# Patient Record
Sex: Female | Born: 1960 | Race: White | Hispanic: No | Marital: Married | Smoking: Never smoker
Health system: Southern US, Community
[De-identification: ages and names within clinical notes are randomized; demographics above are authoritative.]

## PROBLEM LIST (undated history)

## (undated) DIAGNOSIS — J302 Other seasonal allergic rhinitis: Secondary | ICD-10-CM

## (undated) DIAGNOSIS — R7401 Elevation of levels of liver transaminase levels: Secondary | ICD-10-CM

## (undated) DIAGNOSIS — Z860101 Personal history of adenomatous and serrated colon polyps: Secondary | ICD-10-CM

## (undated) DIAGNOSIS — K219 Gastro-esophageal reflux disease without esophagitis: Secondary | ICD-10-CM

## (undated) DIAGNOSIS — Z8719 Personal history of other diseases of the digestive system: Secondary | ICD-10-CM

## (undated) DIAGNOSIS — K529 Noninfective gastroenteritis and colitis, unspecified: Secondary | ICD-10-CM

## (undated) DIAGNOSIS — T7840XA Allergy, unspecified, initial encounter: Secondary | ICD-10-CM

## (undated) DIAGNOSIS — Z87898 Personal history of other specified conditions: Secondary | ICD-10-CM

## (undated) DIAGNOSIS — E782 Mixed hyperlipidemia: Secondary | ICD-10-CM

## (undated) DIAGNOSIS — M1990 Unspecified osteoarthritis, unspecified site: Secondary | ICD-10-CM

## (undated) DIAGNOSIS — M79671 Pain in right foot: Secondary | ICD-10-CM

## (undated) DIAGNOSIS — N912 Amenorrhea, unspecified: Secondary | ICD-10-CM

## (undated) DIAGNOSIS — Z86010 Personal history of colonic polyps: Secondary | ICD-10-CM

## (undated) DIAGNOSIS — R7301 Impaired fasting glucose: Secondary | ICD-10-CM

## (undated) DIAGNOSIS — R740 Nonspecific elevation of levels of transaminase and lactic acid dehydrogenase [LDH]: Secondary | ICD-10-CM

## (undated) DIAGNOSIS — I10 Essential (primary) hypertension: Secondary | ICD-10-CM

## (undated) DIAGNOSIS — N182 Chronic kidney disease, stage 2 (mild): Secondary | ICD-10-CM

## (undated) DIAGNOSIS — Z5189 Encounter for other specified aftercare: Secondary | ICD-10-CM

## (undated) DIAGNOSIS — I700 Atherosclerosis of aorta: Secondary | ICD-10-CM

## (undated) HISTORY — PX: COLONOSCOPY W/ POLYPECTOMY: SHX1380

## (undated) HISTORY — DX: Unspecified osteoarthritis, unspecified site: M19.90

## (undated) HISTORY — DX: Atherosclerosis of aorta: I70.0

## (undated) HISTORY — DX: Personal history of adenomatous and serrated colon polyps: Z86.0101

## (undated) HISTORY — PX: POLYPECTOMY: SHX149

## (undated) HISTORY — DX: Gastro-esophageal reflux disease without esophagitis: K21.9

## (undated) HISTORY — PX: OTHER SURGICAL HISTORY: SHX169

## (undated) HISTORY — DX: Elevation of levels of liver transaminase levels: R74.01

## (undated) HISTORY — DX: Essential (primary) hypertension: I10

## (undated) HISTORY — DX: Pain in right foot: M79.671

## (undated) HISTORY — DX: Impaired fasting glucose: R73.01

## (undated) HISTORY — DX: Personal history of other specified conditions: Z87.898

## (undated) HISTORY — DX: Personal history of colonic polyps: Z86.010

## (undated) HISTORY — DX: Chronic kidney disease, stage 2 (mild): N18.2

## (undated) HISTORY — PX: COLPOSCOPY: SHX161

## (undated) HISTORY — DX: Personal history of other diseases of the digestive system: Z87.19

## (undated) HISTORY — DX: Encounter for other specified aftercare: Z51.89

## (undated) HISTORY — DX: Other seasonal allergic rhinitis: J30.2

## (undated) HISTORY — DX: Amenorrhea, unspecified: N91.2

## (undated) HISTORY — DX: Nonspecific elevation of levels of transaminase and lactic acid dehydrogenase (ldh): R74.0

---
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
## 2012-09-05 ENCOUNTER — Ambulatory Visit (INDEPENDENT_AMBULATORY_CARE_PROVIDER_SITE_OTHER): Payer: BC Managed Care – PPO | Admitting: Family Medicine

## 2012-09-05 ENCOUNTER — Encounter: Payer: Self-pay | Admitting: Family Medicine

## 2012-09-05 VITALS — BP 166/103 | HR 118 | Temp 98.0°F | Resp 16 | Ht 60.0 in | Wt 162.2 lb

## 2012-09-05 DIAGNOSIS — IMO0002 Reserved for concepts with insufficient information to code with codable children: Secondary | ICD-10-CM

## 2012-09-05 DIAGNOSIS — J309 Allergic rhinitis, unspecified: Secondary | ICD-10-CM

## 2012-09-05 DIAGNOSIS — R6889 Other general symptoms and signs: Secondary | ICD-10-CM

## 2012-09-05 DIAGNOSIS — I10 Essential (primary) hypertension: Secondary | ICD-10-CM

## 2012-09-05 DIAGNOSIS — J302 Other seasonal allergic rhinitis: Secondary | ICD-10-CM

## 2012-09-05 LAB — BASIC METABOLIC PANEL
BUN: 18 mg/dL (ref 6–23)
CO2: 27 mEq/L (ref 19–32)
GFR: 69.71 mL/min (ref >60.00)
Glucose, Bld: 84 mg/dL (ref 70–99)
Potassium: 3.8 mEq/L (ref 3.5–5.1)

## 2012-09-05 MED ORDER — HYDROCHLOROTHIAZIDE 25 MG PO TABS: 25.0000 mg | ORAL_TABLET | Freq: Every day | ORAL | Status: DC

## 2012-09-05 NOTE — Progress Notes (Signed)
Office Note 09/10/2012  CC:  Chief Complaint  Patient presents with  . Establish Care    NP to establish    HPI:  Belinda Cox is a 52 y.o. White female who is here to establish care. Patient's most recent primary MD: she says "none" but in reciting some of her Gates she mentions seeing [facility] and Dr. Greta Doom. Old records were reviewed prior to or during today's visit--she brought in a Pap smear path report from 12/2011.  Patient has had a couple of weeks of runny nose, congestion, sneezing.  Sx's mild, no cough or fever.  She takes some OTC allergy med w/out decongestant but only when her sx's give her a HA.  No ST.  She is not a smoker.  She takes HCTZ for HTN but she doesn't know her dose.  She reports compliance with med, says she hasn't had labs in about a year.  Denies hx of high cholesterol, heart dz, or periph vasc dz.  She has a path report from pap smear done 01/11/2012: AGUS.  Colposcopy and biopsy are recommended per the report and she says she was referred to a GYN--she cannot recall the name of MD or office--and she says they told her "we don't even know why you're here".  Story is unclear to me.  Nevertheless, she is ready for another referral to a GYN MD to get this further looked into. LMP 09/03/12.  She takes OCPs.  Past Medical History  Diagnosis Date  . Osteoarthritis     fingers  . Hypertension   . History of abnormal Pap smear     AGUS    Past Surgical History  Procedure Laterality Date  . Unremarkable      Family History  Problem Relation Age of Onset  . Alcoholism Maternal Grandfather   . Alcoholism Brother   . Arthritis Father     Alive  . Heart disease Maternal Grandmother   . Hypertension Father   . Hypertension Mother     Alive  . Diabetes Mother   . Diabetes Maternal Grandmother   . Diabetes Paternal Grandmother   . Leukemia Sister   . Cancer - Other Maternal Grandmother     Intestinal    History   Social History  .  Marital Status: Married    Spouse Name: N/A    Number of Children: N/A  . Years of Education: N/A   Occupational History  . Not on file.   Social History Main Topics  . Smoking status: Never Smoker   . Smokeless tobacco: Not on file  . Alcohol Use: Not on file  . Drug Use: Not on file  . Sexually Active: Not on file   Other Topics Concern  . Not on file   Social History Narrative   Married, has 2 grown children.   Orig from [place], [place].   Lives in [place], [place].   Occupation: early childhood ed Pharmacist, hospital.   No Tob/alc/drugs.    Outpatient Encounter Prescriptions as of 09/05/2012  Medication Sig Dispense Refill  . Chlorpheniramine Maleate (ALLERGY PO) Take by mouth daily. Aller-Tec allergy.      . hydrochlorothiazide (HYDRODIURIL) 25 MG tablet Take 1 tablet (25 mg total) by mouth daily.  90 tablet  2  . [DISCONTINUED] Norethindrone-Ethinyl Estradiol Triphasic (ARANELLE) 0.5/1/0.5-35 MG-MCG tablet Take 1 tablet by mouth daily.      . [DISCONTINUED] PRESCRIPTION MEDICATION Take 1 each by mouth daily. Hydrochlorothiazide - mg unknown.  No facility-administered encounter medications on file as of 09/05/2012.    No Known Allergies  ROS Review of Systems  Constitutional: Negative for fever and fatigue.  HENT:       See HPI  Eyes: Negative for visual disturbance.  Respiratory: Negative for cough.   Cardiovascular: Negative for chest pain.  Gastrointestinal: Negative for nausea and abdominal pain.  Genitourinary: Negative for dysuria.  Musculoskeletal: Negative for back pain and joint swelling.  Skin: Negative for rash.  Neurological: Negative for weakness and headaches.  Hematological: Negative for adenopathy.    PE; Blood pressure 166/103, pulse 118, temperature 98 F (36.7 C), temperature source Oral, resp. rate 16, height 5' (1.524 m), weight 162 lb 4 oz (73.596 kg), last menstrual period 09/03/2012, SpO2 96.00%. VS: noted--normal. Gen: alert, NAD, NONTOXIC  APPEARING. HEENT: eyes without injection, drainage, or swelling.  Ears: EACs clear, TMs with normal light reflex and landmarks.  Nose: Clear rhinorrhea, with some dried, crusty exudate adherent to mildly injected mucosa.  No purulent d/c.  No paranasal sinus TTP.  No facial swelling.  Throat and mouth without focal lesion.  No pharyngial swelling, erythema, or exudate.   Neck: supple, no LAD.   LUNGS: CTA bilat, nonlabored resps.   CV: RRR, no m/r/g. EXT: no c/c/e SKIN: no rash  Pertinent labs:  None today  ASSESSMENT AND PLAN:   New PT: obtain old records.  HTN (hypertension), benign Check BMET today. Stop OCPs. Encouraged home bp monitoring.  Abnormal Pap smear Refer to GYN: [facility].  Seasonal allergic rhinitis Encouraged saline nasal spray.  She wants to continue generic OTC antihistamines prn. Discussed avoidance of decongestants due to her htn and she expressed understanding.  An After Visit Summary was printed and given to the patient.  Return in about 6 months (around 03/07/2013) for f/u HTN.

## 2012-09-10 ENCOUNTER — Encounter: Payer: Self-pay | Admitting: Family Medicine

## 2012-09-10 ENCOUNTER — Telehealth: Payer: Self-pay | Admitting: *Deleted

## 2012-09-10 ENCOUNTER — Encounter: Payer: Self-pay | Admitting: *Deleted

## 2012-09-10 DIAGNOSIS — IMO0002 Reserved for concepts with insufficient information to code with codable children: Secondary | ICD-10-CM | POA: Insufficient documentation

## 2012-09-10 DIAGNOSIS — I10 Essential (primary) hypertension: Secondary | ICD-10-CM | POA: Insufficient documentation

## 2012-09-10 DIAGNOSIS — J302 Other seasonal allergic rhinitis: Secondary | ICD-10-CM | POA: Insufficient documentation

## 2012-09-10 HISTORY — DX: Other seasonal allergic rhinitis: J30.2

## 2012-09-10 NOTE — Assessment & Plan Note (Signed)
Refer to GYN: [facility].

## 2012-09-10 NOTE — Assessment & Plan Note (Signed)
Encouraged saline nasal spray.  She wants to continue generic OTC antihistamines prn. Discussed avoidance of decongestants due to her htn and she expressed understanding.

## 2012-09-10 NOTE — Assessment & Plan Note (Addendum)
Check BMET today. Stop OCPs. Encouraged home bp monitoring.

## 2012-09-10 NOTE — Telephone Encounter (Signed)
error 

## 2012-09-16 ENCOUNTER — Encounter: Payer: Self-pay | Admitting: Gynecology

## 2012-09-16 ENCOUNTER — Ambulatory Visit (INDEPENDENT_AMBULATORY_CARE_PROVIDER_SITE_OTHER): Payer: BC Managed Care – PPO | Admitting: Gynecology

## 2012-09-16 VITALS — BP 106/60 | Ht 59.5 in | Wt 158.0 lb

## 2012-09-16 DIAGNOSIS — Z0000 Encounter for general adult medical examination without abnormal findings: Secondary | ICD-10-CM

## 2012-09-16 DIAGNOSIS — N915 Oligomenorrhea, unspecified: Secondary | ICD-10-CM

## 2012-09-16 DIAGNOSIS — Z8742 Personal history of other diseases of the female genital tract: Secondary | ICD-10-CM

## 2012-09-16 DIAGNOSIS — R87811 Vaginal high risk human papillomavirus (HPV) DNA test positive: Secondary | ICD-10-CM

## 2012-09-16 DIAGNOSIS — IMO0002 Reserved for concepts with insufficient information to code with codable children: Secondary | ICD-10-CM

## 2012-09-16 DIAGNOSIS — Z01419 Encounter for gynecological examination (general) (routine) without abnormal findings: Secondary | ICD-10-CM

## 2012-09-16 DIAGNOSIS — Z87898 Personal history of other specified conditions: Secondary | ICD-10-CM

## 2012-09-16 LAB — POCT URINALYSIS DIPSTICK
Urobilinogen, UA: NEGATIVE
pH, UA: 5

## 2012-09-16 NOTE — Patient Instructions (Signed)
EXERCISE AND DIET:  We recommended that you start or continue a regular exercise program for good health. Regular exercise means any activity that makes your heart beat faster and makes you sweat.  We recommend exercising at least 30 minutes per day at least 3 days a week, preferably 4 or 5.  We also recommend a diet low in fat and sugar.  Inactivity, poor dietary choices and obesity can cause diabetes, heart attack, stroke, and kidney damage, among others.    ALCOHOL AND SMOKING:  Women should limit their alcohol intake to no more than 7 drinks/beers/glasses of wine (combined, not each!) per week. Moderation of alcohol intake to this level decreases your risk of breast cancer and liver damage. And of course, no recreational drugs are part of a healthy lifestyle.  And absolutely no smoking or even second hand smoke. Most people know smoking can cause heart and lung diseases, but did you know it also contributes to weakening of your bones? Aging of your skin?  Yellowing of your teeth and nails?  CALCIUM AND VITAMIN D:  Adequate intake of calcium and Vitamin D are recommended.  The recommendations for exact amounts of these supplements seem to change often, but generally speaking 600 mg of calcium (either carbonate or citrate) and 800 units of Vitamin D per day seems prudent. Certain women may benefit from higher intake of Vitamin D.  If you are among these women, your doctor will have told you during your visit.    PAP SMEARS:  Pap smears, to check for cervical cancer or precancers,  have traditionally been done yearly, although recent scientific advances have shown that most women can have pap smears less often.  However, every woman still should have a physical exam from her gynecologist every year. It will include a breast check, inspection of the vulva and vagina to check for abnormal growths or skin changes, a visual exam of the cervix, and then an exam to evaluate the size and shape of the uterus and  ovaries.  And after 53 years of age, a rectal exam is indicated to check for rectal cancers. We will also provide age appropriate advice regarding health maintenance, like when you should have certain vaccines, screening for sexually transmitted diseases, bone density testing, colonoscopy, mammograms, etc.   MAMMOGRAMS:  All women over 53 years old should have a yearly mammogram. Many facilities now offer a "3D" mammogram, which may cost around $50 extra out of pocket. If possible,  we recommend you accept the option to have the 3D mammogram performed.  It both reduces the number of women who will be called back for extra views which then turn out to be normal, and it is better than the routine mammogram at detecting truly abnormal areas.    COLONOSCOPY:  Colonoscopy to screen for colon cancer is recommended for all women at age 53.  We know, you hate the idea of the prep.  We agree, BUT, having colon cancer and not knowing it is worse!!  Colon cancer so often starts as a polyp that can be seen and removed at colonscopy, which can quite literally save your life!  And if your first colonoscopy is normal and you have no family history of colon cancer, most women don't have to have it again for 10 years.  Once every ten years, you can do something that may end up saving your life, right?  We will be happy to help you get it scheduled when you are ready.  Be sure to check your insurance coverage so you understand how much it will cost.  It may be covered as a preventative service at no cost, but you should check your particular policy.     Abnormal Pap Test Information During a Pap test, the cells on the surface of your cervix are checked to see if they look normal, abnormal, or if they show signs of having been altered by a certain type of virus called human papillomavirus, or HPV. Cervical cells that have been affected by HPV are called dysplasia. Dysplasia is not cancer, but describes abnormal cells found on  the surface of the cervix. Depending on the degree of dysplasia, some of the cells may be considered pre-cancerous and may turn into cancer over time if follow up with a caregiver is delayed.  WHAT DOES AN ABNORMAL PAP TEST MEAN? Having an abnormal pap test does not mean that you have cancer. However, certain types of abnormal pap tests can be a sign that a person is at a higher risk of developing cancer. Your caregiver will want to do other tests to find out more about the abnormal cells. Your abnormal Pap test results could show:   Small and uncertain changes that should be carefully watched.   Cervical dysplasia that has caused mild changes and can be followed over time.  Cervical dysplasia that is more severe and needs to be followed and treated to ensure the problem goes away.  Cancer.  When severe cervical dysplasia is found and treated early, it rarely will grow into cancer.  WHAT WILL BE DONE ABOUT MY ABNORMAL PAP TEST?  A colposcopy may be needed. This is a procedure where your cervix is examined using light and magnification.  A small tissue sample of your cervix (biopsy) may need to be removed and then examined. This is often performed if there are areas that appear infected.  A sample of cells from the cervical canal may be removed with either a small brush or scraping instrument (curette). Based on the results of the procedures above, some caregivers may recommend either cryotherapy of the cervix or a surgical LEEP where a portion of the cervix is removed. LEEP is short for "loop electrical excisional procedure." Rarely, a caregiver may recommend a cone biopsy.This is a procedure where a small, cone-shaped sample of your cervix is taken out. The part that is taken out is the area where the abnormal cells are.  WHAT IF I HAVE A DYSPLASIA OR A CANCER? You may be referred to a specialist. Radiation may also be a treatment for more advanced cancer. Having a hysterectomy is the  last treatment option for dysplasia, but it is a more common treatment for someone with cancer. All treatment options will be discussed with you by your caregiver. WHAT SHOULD YOU DO AFTER BEING TREATED? If you have had an abnormal pap test, you should continue to have regular pap tests and check-ups as directed by your caregiver. Your cervical problem will be carefully watched so it does not get worse. Also, your caregiver can watch for, and treat, any new problems that may come up. Document Released: 08/16/2010 Document Revised: 07/24/2011 Document Reviewed: 04/27/2011 [facility] Patient Information 2013 [place].

## 2012-09-16 NOTE — Progress Notes (Signed)
52 y.o.  Married  Caucasian female  G2P2. here for annual exam.  Pt had abnormal PAP without follow up in 12/2101 of AGUS here now for evaluation and to discuss contraception.  She was recently taken off oc due to HTN- d/c'd 2w ago.  During sugar pill week.  Pt denies post-coital bleed.  Pt reports some abnormal PAP approx 15y ago but f/u negative and all PAP's normal after.  Pt denies any sx of coming off estrogen, no hot flushes.  Using oc primarily as contraception.  Patient's last menstrual period was 09/03/2012.          Sexually active: yes  The current method of family planning is none.  Exercising: no Last mammogram:  Age 56 Last pap smear: 01/11/12 History of abnormal pap:  yes, AGC/AGUS 01/11/12 Smoking: no Alcohol: no Last colonoscopy: none Last Bone Density:  none Last tetanus shot: not sure  Last cholesterol check: 2 weeks ago BSE: yes  Hgb: PCP   Urine: Leuks 2    Health Maintenance  Topic Date Due  . Pap Smear  01/18/1978  . Tetanus/tdap  01/19/1979  . Mammogram  01/18/2010  . Colonoscopy  01/18/2010  . Influenza Vaccine  01/13/2013    Family History  Problem Relation Age of Onset  . Alcoholism Maternal Grandfather   . Alcoholism Brother   . Arthritis Father     Alive  . Heart disease Maternal Grandmother   . Hypertension Father   . Hypertension Mother     Alive  . Diabetes Mother   . Diabetes Maternal Grandmother   . Diabetes Paternal Grandmother   . Leukemia Sister   . Cancer - Other Maternal Grandmother     Intestinal    Patient Active Problem List   Diagnosis Date Noted  . HTN (hypertension), benign 09/10/2012  . Abnormal Pap smear 09/10/2012  . Seasonal allergic rhinitis 09/10/2012    Past Medical History  Diagnosis Date  . Osteoarthritis     fingers  . Hypertension   . History of abnormal Pap smear     AGUS    Past Surgical History  Procedure Laterality Date  . Unremarkable      Allergies: Review of patient's allergies indicates  no known allergies.  Current Outpatient Prescriptions  Medication Sig Dispense Refill  . Chlorpheniramine Maleate (ALLERGY PO) Take by mouth daily. Aller-Tec allergy.      . hydrochlorothiazide (HYDRODIURIL) 25 MG tablet Take 1 tablet (25 mg total) by mouth daily.  90 tablet  2   No current facility-administered medications for this visit.    ROS: Pertinent items are noted in HPI.  Social Hx:    Exam:    LMP 09/03/2012   Wt Readings from Last 3 Encounters:  09/05/12 162 lb 4 oz (73.596 kg)     Ht Readings from Last 3 Encounters:  09/05/12 5' (1.524 m)    General appearance: alert, cooperative and appears stated age Head: Normocephalic, without obvious abnormality, atraumatic Neck: no adenopathy, supple, symmetrical, trachea midline and thyroid not enlarged, symmetric, no tenderness/mass/nodules Lungs: clear to auscultation bilaterally Breasts: Inspection negative, No nipple retraction or dimpling, No nipple discharge or bleeding, No axillary or supraclavicular adenopathy, Normal to palpation without dominant masses Heart: regular rate and rhythm Abdomen: soft, non-tender; bowel sounds normal; no masses,  no organomegaly Extremities: extremities normal, atraumatic, no cyanosis or edema Skin: Skin color, texture, turgor normal. No rashes or lesions Lymph nodes: Cervical, supraclavicular, and axillary nodes normal. No abnormal inguinal nodes  palpated Neurologic: Grossly normal   Pelvic: External genitalia:  no lesions              Urethra:  normal appearing urethra with no masses, tenderness or lesions              Bartholins and Skenes: normal                 Vagina: normal appearing vagina with normal color and discharge, no lesions              Cervix: normal appearance              Pap taken: yes        Bimanual Exam:  Uterus:  uterus is normal size, shape, consistency and nontender                                      Adnexa: normal adnexa in size, nontender and no  masses                                      Rectovaginal: Confirms                                      Anus:  normal sphincter tone, no lesions  A: normal gyn exam HTN- off oc     P: mammogram stressed pap smear with HR HPV, will triage based on results-expect u/s, EMB, colpo questions addressed Overdue for coloscopy Will evaluate menopausal status off oc return annually or prn     An After Visit Summary was printed and given to the patient.

## 2012-09-19 NOTE — Addendum Note (Signed)
Addended by: Elveria Rising on: 09/19/2012 03:41 PM   Modules accepted: Orders

## 2012-09-20 ENCOUNTER — Telehealth: Payer: Self-pay | Admitting: *Deleted

## 2012-09-20 NOTE — Telephone Encounter (Signed)
Patient returning call.  Advised Pap still showing abnormality and Dr lathrop would like to proceed with evaluation.  Colpo and endo bx scheduled for Mon 09-23-12.  Advied to take Motrin 800 mg 1 hr before with food.

## 2012-09-20 NOTE — Telephone Encounter (Signed)
Message copied by Jaymes Graff on Fri Sep 20, 2012 11:26 AM ------      Message from: Elveria Rising      Created: Thu Sep 19, 2012  3:42 PM       Needs colpo, emb also has h/o agus not prev evaluated, dropped order ------

## 2012-09-20 NOTE — Telephone Encounter (Signed)
Message copied by Jaymes Graff on Fri Sep 20, 2012 10:37 AM ------      Message from: Elveria Rising      Created: Thu Sep 19, 2012  3:42 PM       Needs colpo, emb also has h/o agus not prev evaluated, dropped order ------

## 2012-09-20 NOTE — Telephone Encounter (Signed)
LMTCB

## 2012-09-23 ENCOUNTER — Ambulatory Visit (INDEPENDENT_AMBULATORY_CARE_PROVIDER_SITE_OTHER): Payer: BC Managed Care – PPO | Admitting: Gynecology

## 2012-09-23 VITALS — BP 126/80

## 2012-09-23 DIAGNOSIS — IMO0002 Reserved for concepts with insufficient information to code with codable children: Secondary | ICD-10-CM

## 2012-09-23 DIAGNOSIS — Z87898 Personal history of other specified conditions: Secondary | ICD-10-CM

## 2012-09-23 DIAGNOSIS — R6889 Other general symptoms and signs: Secondary | ICD-10-CM

## 2012-09-23 DIAGNOSIS — Z8742 Personal history of other diseases of the female genital tract: Secondary | ICD-10-CM

## 2012-09-23 NOTE — Procedures (Signed)
52 y.o. MarriedCaucasianfemale   here for colposcopy. 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 History of AGUS 8/13 without further evaluation  Patient's last menstrual period was 09/07/2012.  Contraception:  no method   Blood pressure 126/80, last menstrual period 09/07/2012.  Procedure explained and patient's questions were invited and answered.  Consent form signed.    Role of HPV in genesis of SIL discussed with patient, and questions answered.   Speculum inserted atraumatically and cervix visualized.  3% acetic acid applied.  Cervix examined using 3.75,7.5,15  X magnification and green filter.    Gross appearance:normal  Squamocolumnar junction seen in entirety: yes  Extent of lesion entirely seen: yes   no visible lesions, no mosaicism, no punctation and no abnormal vasculaturecervix swabbed with Lugol's solution, endocervical speculum placed, SCJ visualized - lesion at 12 o'clock, endocervical lesion noted at 6 o'clock, endometrial biopsy performed, cervical biopsies taken at 12,6 o'clock, specimen labelled and sent to pathology and hemostasis achieved with Monsel's solution  Patient tolerated procedure well.   Plan:  Will base further treatment on Pathology findings.  Post biopsy instructions and AVS given to patient.   Uterus sounded to 7cm, 2 passes of tissue, minimal tissue to pathology

## 2012-09-23 NOTE — Progress Notes (Signed)
See procedure note, for colpo and bx- ASCUS HRHVP, H/O AGUS

## 2012-09-24 ENCOUNTER — Other Ambulatory Visit (INDEPENDENT_AMBULATORY_CARE_PROVIDER_SITE_OTHER): Payer: BC Managed Care – PPO

## 2012-09-24 DIAGNOSIS — N915 Oligomenorrhea, unspecified: Secondary | ICD-10-CM

## 2012-09-25 LAB — ESTRADIOL: Estradiol: 18.1 pg/mL

## 2012-09-26 LAB — IPS CERVICAL/ECC/EMB/VULVAR/VAGINAL BIOPSY

## 2012-09-26 LAB — ANTI MULLERIAN HORMONE: AMH AssessR: <0.03 ng/mL

## 2012-10-03 ENCOUNTER — Telehealth: Payer: Self-pay | Admitting: Gynecology

## 2012-10-03 NOTE — Telephone Encounter (Signed)
Patient saw Dr. Charlies Constable on 5/12 and was told she would be prescribed the lowest dose estrogen BC pill.  Has not heard anything.

## 2012-10-03 NOTE — Telephone Encounter (Signed)
Pt asking for a low dose estrogen BC pill, office visit 09/23/12.

## 2012-10-04 NOTE — Telephone Encounter (Signed)
LMTCB  aa

## 2012-10-04 NOTE — Telephone Encounter (Signed)
Pt is postmenopausal by [facility], she was informed, no ocp needed,  watch for any more bleeding

## 2012-10-04 NOTE — Telephone Encounter (Signed)
Pt returned Jasmine's call.

## 2012-10-08 NOTE — Telephone Encounter (Signed)
Pt notfied and aware she does not need a ocp due to her being menopausal; pt is also aware to call if she has any more bleeding.

## 2012-10-08 NOTE — Telephone Encounter (Signed)
Left Message To Call Back  

## 2013-03-20 ENCOUNTER — Other Ambulatory Visit: Payer: Self-pay

## 2013-04-24 ENCOUNTER — Ambulatory Visit (INDEPENDENT_AMBULATORY_CARE_PROVIDER_SITE_OTHER): Payer: BC Managed Care – PPO | Admitting: Family Medicine

## 2013-04-24 ENCOUNTER — Encounter: Payer: Self-pay | Admitting: Family Medicine

## 2013-04-24 VITALS — BP 159/102 | HR 108 | Temp 99.8°F | Resp 18 | Ht 60.0 in | Wt 157.0 lb

## 2013-04-24 DIAGNOSIS — J069 Acute upper respiratory infection, unspecified: Secondary | ICD-10-CM

## 2013-04-24 DIAGNOSIS — J040 Acute laryngitis: Secondary | ICD-10-CM | POA: Insufficient documentation

## 2013-04-24 NOTE — Progress Notes (Signed)
Pre visit review using our clinic review tool, if applicable. No additional management support is needed unless otherwise documented below in the visit note.  OFFICE NOTE  04/24/2013  CC:  Chief Complaint  Patient presents with  . Cough    dry cough  . Fatigue     HPI: Patient is a 52 y.o. Caucasian female who is here for cough. Onset 5 d/a with dry cough, then lost voice, cough is not constant and it hurts in ears when she coughs.  No nasal congestion or drainage.  No body aches or fevers.  Dayquil yesterday evening x 1 dose no help.  No SOB or wheezing.  No ST or rash.  BP at home has been fine per pt.  Pertinent PMH:  Past Medical History  Diagnosis Date  . Osteoarthritis     fingers  . Hypertension   . History of abnormal Pap smear     AGUS  . Amenorrhea   . Blood transfusion without reported diagnosis     Birth    MEDS:  Outpatient Prescriptions Prior to Visit  Medication Sig Dispense Refill  . Chlorpheniramine Maleate (ALLERGY PO) Take by mouth daily. Aller-Tec allergy.      . hydrochlorothiazide (HYDRODIURIL) 25 MG tablet Take 1 tablet (25 mg total) by mouth daily.  90 tablet  2   No facility-administered medications prior to visit.    PE: Blood pressure 159/102, pulse 108, temperature 99.8 F (37.7 C), temperature source Temporal, resp. rate 18, height 5' (1.524 m), weight 157 lb (71.215 kg), last menstrual period 09/07/2012, SpO2 97.00%. Gen: Alert, well appearing.  Patient is oriented to person, place, time, and situation. ENT: Ears: EACs clear, normal epithelium.  TMs with good light reflex and landmarks bilaterally.  Eyes: no injection, icteris, swelling, or exudate.  EOMI, PERRLA. Nose: no drainage or turbinate edema/swelling.  No injection or focal lesion.  Mouth: lips without lesion/swelling.  Oral mucosa pink and moist.  Dentition intact and without obvious caries or gingival swelling.  Oropharynx without erythema, exudate, or swelling.  Neck - No  masses or thyromegaly or limitation in range of motion CV: RRR, no m/r/g.   LUNGS: CTA bilat, nonlabored resps, good aeration in all lung fields.   IMPRESSION AND PLAN:  URI, laryngitis.  No sign of bacterial infection or RAD. REcommended symptomatic care with saline nasal spray, mucinex or mucinex DM, and stay home from work tomorrow. Avoid OTC decongestants.  Call/return if bp up at home persistently.  FOLLOW UP: prn

## 2013-08-11 ENCOUNTER — Ambulatory Visit (INDEPENDENT_AMBULATORY_CARE_PROVIDER_SITE_OTHER): Payer: BC Managed Care – PPO | Admitting: Family Medicine

## 2013-08-11 ENCOUNTER — Encounter: Payer: Self-pay | Admitting: Family Medicine

## 2013-08-11 VITALS — BP 153/107 | HR 95 | Temp 98.6°F | Resp 16 | Ht 60.0 in | Wt 156.0 lb

## 2013-08-11 DIAGNOSIS — Z0000 Encounter for general adult medical examination without abnormal findings: Secondary | ICD-10-CM | POA: Insufficient documentation

## 2013-08-11 DIAGNOSIS — Z1239 Encounter for other screening for malignant neoplasm of breast: Secondary | ICD-10-CM

## 2013-08-11 DIAGNOSIS — Z1211 Encounter for screening for malignant neoplasm of colon: Secondary | ICD-10-CM

## 2013-08-11 DIAGNOSIS — Z23 Encounter for immunization: Secondary | ICD-10-CM

## 2013-08-11 LAB — LIPID PANEL
Cholesterol: 176 mg/dL (ref 0–200)
HDL: 52.7 mg/dL (ref >39.00)
LDL Cholesterol: 84 mg/dL (ref 0–99)
TRIGLYCERIDES: 197 mg/dL — AB (ref 0.0–149.0)
Total CHOL/HDL Ratio: 3
VLDL: 39.4 mg/dL (ref 0.0–40.0)

## 2013-08-11 LAB — COMPREHENSIVE METABOLIC PANEL
ALT: 85 U/L — ABNORMAL HIGH (ref 0–35)
AST: 38 U/L — AB (ref 0–37)
Albumin: 4.5 g/dL (ref 3.5–5.2)
Alkaline Phosphatase: 97 U/L (ref 39–117)
BUN: 14 mg/dL (ref 6–23)
CALCIUM: 9.5 mg/dL (ref 8.4–10.5)
CHLORIDE: 101 meq/L (ref 96–112)
CO2: 28 mEq/L (ref 19–32)
Creatinine, Ser: 0.9 mg/dL (ref 0.4–1.2)
GFR: 71.29 mL/min (ref >60.00)
GLUCOSE: 92 mg/dL (ref 70–99)
POTASSIUM: 3.6 meq/L (ref 3.5–5.1)
SODIUM: 138 meq/L (ref 135–145)
TOTAL PROTEIN: 7.6 g/dL (ref 6.0–8.3)
Total Bilirubin: 0.5 mg/dL (ref 0.3–1.2)

## 2013-08-11 LAB — TSH: TSH: 1.14 u[IU]/mL (ref 0.35–5.50)

## 2013-08-11 MED ORDER — MONTELUKAST SODIUM 10 MG PO TABS: 10.0000 mg | ORAL_TABLET | Freq: Every day | ORAL | Status: DC

## 2013-08-11 MED ORDER — HYDROCHLOROTHIAZIDE 25 MG PO TABS: 25.0000 mg | ORAL_TABLET | Freq: Every day | ORAL | Status: DC

## 2013-08-11 NOTE — Progress Notes (Signed)
Pre visit review using our clinic review tool, if applicable. No additional management support is needed unless otherwise documented below in the visit note. 

## 2013-08-11 NOTE — Progress Notes (Signed)
Office Note 08/11/2013  CC:  Chief Complaint  Patient presents with  . Annual Exam  . Medication Refill    HCTZ    HPI:  Belinda Cox is a 53 y.o. White female who is here for CPE.   Reports home bp 120-130s over 80s-low 90s.   Compliant with HCTZ daily. She last ate almost 9 hours ago except coffee with cream and sugar about 7 hrs ago. She is agreeable to colon cancer screening, GI referral today. Also ok with me scheduling her screening mammogram.  Past Medical History  Diagnosis Date  . Osteoarthritis     fingers  . Hypertension   . 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 Amenorrhea   . Blood transfusion without reported diagnosis     Birth  . Seasonal allergic rhinitis 09/10/2012    Past Surgical History  Procedure Laterality Date  . Unremarkable      Family History  Problem Relation Age of Onset  . Alcoholism Maternal Grandfather   . Alcoholism Brother   . Arthritis Father     Alive  . Heart disease Maternal Grandmother   . Hypertension Father   . Hypertension Mother     Alive  . Diabetes Mother   . Diabetes Maternal Grandmother   . Diabetes Paternal Grandmother   . Leukemia Sister   . Cancer - Other Maternal Grandmother     Intestinal    History   Social History  . Marital Status: Married    Spouse Name: N/A    Number of Children: N/A  . Years of Education: N/A   Occupational History  . Not on file.   Social History Main Topics  . Smoking status: Never Smoker   . Smokeless tobacco: Not on file  . Alcohol Use: No  . Drug Use: No  . Sexual Activity: Yes    Partners: Male    Birth Control/ Protection: None   Other Topics Concern  . Not on file   Social History Narrative   Married, has 2 grown children.   Orig from [place], [place].   Lives in [place], [place].   Occupation: early childhood ed Pharmacist, hospital.   No Tob/alc/drugs.    Outpatient Prescriptions  Prior to Visit  Medication Sig Dispense Refill  . Chlorpheniramine Maleate (ALLERGY PO) Take by mouth daily. Aller-Tec allergy.      . hydrochlorothiazide (HYDRODIURIL) 25 MG tablet Take 1 tablet (25 mg total) by mouth daily.  90 tablet  2   No facility-administered medications prior to visit.    No Known Allergies  ROS Review of Systems  Constitutional: Negative for fever, chills, appetite change and fatigue.  HENT: Positive for congestion (chronic nasal congestion; has dog allergy yet she has dogs in her home). Negative for dental problem, ear pain and sore throat.   Eyes: Negative for discharge, redness and visual disturbance.  Respiratory: Negative for cough, chest tightness, shortness of breath and wheezing.   Cardiovascular: Negative for chest pain, palpitations and leg swelling.  Gastrointestinal: Negative for nausea, vomiting, abdominal pain, diarrhea and blood in stool.  Genitourinary: Negative for dysuria, urgency, frequency, hematuria, flank pain and difficulty urinating.  Musculoskeletal: Negative for arthralgias, back pain, joint swelling, myalgias and neck stiffness.  Skin: Negative for pallor and rash.  Neurological: Negative for dizziness, speech difficulty, weakness and headaches.  Hematological: Negative for adenopathy. Does not bruise/bleed easily.  Psychiatric/Behavioral:  Negative for confusion and sleep disturbance. The patient is not nervous/anxious.     PE; Blood pressure 153/107, pulse 95, temperature 98.6 F (37 C), temperature source Oral, resp. rate 16, height 5' (1.524 m), weight 156 lb (70.761 kg), last menstrual period 09/07/2012, SpO2 98.00%. Gen: Alert, well appearing.  Patient is oriented to person, place, time, and situation. AFFECT: pleasant, lucid thought and speech. ENT: Ears: EACs clear, normal epithelium.  TMs with good light reflex and landmarks bilaterally.  Eyes: no injection, icteris, swelling, or exudate.  EOMI, PERRLA. Nose: no drainage or  turbinate edema/swelling.  No injection or focal lesion.  Mouth: lips without lesion/swelling.  Oral mucosa pink and moist.  Dentition intact and without obvious caries or gingival swelling.  Oropharynx without erythema, exudate, or swelling.  Neck: supple/nontender.  No LAD, mass, or TM.  Carotid pulses 2+ bilaterally, without bruits. CV: RRR, no m/r/g.   LUNGS: CTA bilat, nonlabored resps, good aeration in all lung fields. ABD: soft, NT, ND, BS normal.  No hepatospenomegaly or mass.  No bruits. EXT: no clubbing, cyanosis, or edema.  Musculoskeletal: no joint swelling, erythema, warmth, or tenderness.  ROM of all joints intact. Skin - no sores or suspicious lesions or rashes or color changes  Pertinent labs:  None today  ASSESSMENT AND PLAN:   Health maintenance examination Reviewed age and gender appropriate health maintenance issues (prudent diet, regular exercise, health risks of tobacco and excessive alcohol, use of seatbelts, fire alarms in home, use of sunscreen).  Also reviewed age and gender appropriate health screening as well as vaccine recommendations. Next pap due 09/2013. Referred to GI for colon cancer screening.   Screening mammogram ordered. Health panel labs drawn today. HTN well controlled per home monitoring, HCTZ 47m qd refilled today. Will start trial of singulair 174mqd to see if it helps with her chronic nasal congestion (nasal sprays cause nose bleeds, OTC zyrtec not very helpful, nasal breath-rite strips helpful during sleep but cannot wear in daytime, obviously).   An After Visit Summary was printed and given to the patient.  FOLLOW UP:  Return in about 6 months (around 02/11/2014) for HTN and chronic rhinitis f/u.

## 2013-08-11 NOTE — Assessment & Plan Note (Addendum)
Reviewed age and gender appropriate health maintenance issues (prudent diet, regular exercise, health risks of tobacco and excessive alcohol, use of seatbelts, fire alarms in home, use of sunscreen).  Also reviewed age and gender appropriate health screening as well as vaccine recommendations. Tdap today. Next pap due 09/2013. Referred to GI for colon cancer screening.   Screening mammogram ordered. Health panel labs drawn today. HTN well controlled per home monitoring, HCTZ 80m qd refilled today. Will start trial of singulair 117mqd to see if it helps with her chronic nasal congestion (nasal sprays cause nose bleeds, OTC zyrtec not very helpful, nasal breath-rite strips helpful during sleep but cannot wear in daytime, obviously).

## 2013-08-12 LAB — CBC WITH DIFFERENTIAL/PLATELET
Basophils Absolute: 0 10*3/uL (ref 0.0–0.1)
Basophils Relative: 0.4 % (ref 0.0–3.0)
EOS PCT: 1.6 % (ref 0.0–5.0)
Eosinophils Absolute: 0.1 10*3/uL (ref 0.0–0.7)
HCT: 41.3 % (ref 36.0–46.0)
Hemoglobin: 14 g/dL (ref 12.0–15.0)
Lymphocytes Relative: 39.2 % (ref 12.0–46.0)
Lymphs Abs: 2.4 10*3/uL (ref 0.7–4.0)
MCHC: 33.8 g/dL (ref 30.0–36.0)
MCV: 90.1 fl (ref 78.0–100.0)
MONO ABS: 0.3 10*3/uL (ref 0.1–1.0)
Monocytes Relative: 4.5 % (ref 3.0–12.0)
NEUTROS PCT: 54.3 % (ref 43.0–77.0)
Neutro Abs: 3.3 10*3/uL (ref 1.4–7.7)
PLATELETS: 314 10*3/uL (ref 150.0–400.0)
RBC: 4.59 Mil/uL (ref 3.87–5.11)
RDW: 13.1 % (ref 11.5–14.6)
WBC: 6.1 10*3/uL (ref 4.5–10.5)

## 2013-08-13 ENCOUNTER — Ambulatory Visit (AMBULATORY_SURGERY_CENTER): Payer: Self-pay

## 2013-08-13 VITALS — Ht 59.0 in | Wt 157.0 lb

## 2013-08-13 DIAGNOSIS — Z1211 Encounter for screening for malignant neoplasm of colon: Secondary | ICD-10-CM

## 2013-08-13 HISTORY — PX: COLONOSCOPY W/ POLYPECTOMY: SHX1380

## 2013-08-13 MED ORDER — MOVIPREP 100 G PO SOLR: 1.0000 | Freq: Once | ORAL | Status: DC

## 2013-08-14 ENCOUNTER — Ambulatory Visit: Payer: BC Managed Care – PPO

## 2013-08-14 DIAGNOSIS — R7402 Elevation of levels of lactic acid dehydrogenase (LDH): Secondary | ICD-10-CM

## 2013-08-14 DIAGNOSIS — R740 Nonspecific elevation of levels of transaminase and lactic acid dehydrogenase [LDH]: Principal | ICD-10-CM

## 2013-08-15 LAB — HEPATITIS C ANTIBODY: HCV AB: NEGATIVE

## 2013-08-15 LAB — HEPATITIS B SURFACE ANTIGEN: HEP B S AG: NEGATIVE

## 2013-08-25 ENCOUNTER — Ambulatory Visit (AMBULATORY_SURGERY_CENTER): Payer: BC Managed Care – PPO | Admitting: Internal Medicine

## 2013-08-25 ENCOUNTER — Encounter: Payer: Self-pay | Admitting: Internal Medicine

## 2013-08-25 VITALS — BP 128/85 | HR 80 | Temp 98.7°F | Resp 25 | Ht 59.0 in | Wt 157.0 lb

## 2013-08-25 DIAGNOSIS — K529 Noninfective gastroenteritis and colitis, unspecified: Secondary | ICD-10-CM

## 2013-08-25 DIAGNOSIS — K5289 Other specified noninfective gastroenteritis and colitis: Secondary | ICD-10-CM

## 2013-08-25 DIAGNOSIS — Z1211 Encounter for screening for malignant neoplasm of colon: Secondary | ICD-10-CM

## 2013-08-25 DIAGNOSIS — D126 Benign neoplasm of colon, unspecified: Secondary | ICD-10-CM

## 2013-08-25 MED ORDER — SODIUM CHLORIDE 0.9 % IV SOLN: 500.0000 mL | INTRAVENOUS | Status: DC

## 2013-08-25 NOTE — Op Note (Signed)
[facility]  [address] [place], [address]   COLONOSCOPY PROCEDURE REPORT  PATIENT: Belinda Cox, Belinda Cox  MR#: [id number] BIRTHDATE: [birth year] , 52  yrs. old GENDER: Female ENDOSCOPIST: Eustace Quail, MD REFERRED IF:[id number] Anitra Lauth, M.D. PROCEDURE DATE:  08/25/2013 PROCEDURE:   Colonoscopy with biopsy and Colonoscopy with snare polypectomy x 1 First Screening Colonoscopy - Avg.  risk and is 50 yrs.  old or older Yes.  Prior Negative Screening - Now for repeat screening. N/A  History of Adenoma - Now for follow-up colonoscopy & has been > or = to 3 yrs.  N/A  Polyps Removed Today? Yes. ASA CLASS:   Class II INDICATIONS:average risk screening. MEDICATIONS: MAC sedation, administered by CRNA and propofol (Diprivan) 254m IV  DESCRIPTION OF PROCEDURE:   After the risks benefits and alternatives of the procedure were thoroughly explained, informed consent was obtained.  A digital rectal exam revealed no abnormalities of the rectum.   The LB [id number] endoscope was introduced through the anus and advanced to the cecum, which was identified by both the appendix and ileocecal valve. No adverse events experienced.   The quality of the prep was excellent, using MoviPrep  The instrument was then slowly withdrawn as the colon was fully examined.  COLON FINDINGS: The mucosa appeared normal in the terminal ileum. A sessile polyp measuring 6 mm in size was found in the transverse colon.  A polypectomy was performed with a cold snare.  The resection was complete and the polyp tissue was completely retrieved. There was a mild focal colitis, nonspecific, in the cecal base. Biopsies taken.   The colon mucosa was otherwise normal.  Retroflexed views revealed no abnormalities. The time to cecum=2 minutes 24 seconds.  Withdrawal time=9 minutes 59 seconds. The scope was withdrawn and the procedure completed. COMPLICATIONS: There were no complications.  ENDOSCOPIC  IMPRESSION: 1.   Normal mucosa in the terminal ileum 2.   Sessile polyp measuring 6 mm in size was found in the transverse colon; polypectomy was performed with a cold snare 3.   Mild focal inflammation of the cecum status post biopsies. The colon mucosa was otherwise normal  RECOMMENDATIONS: 1. Repeat colonoscopy in 5 years if polyp adenomatous; otherwise 10 years   eSigned:  JEustace Quail MD 08/25/2013 8:32 AM   cc: PRicardo Jericho MD and The Patient

## 2013-08-25 NOTE — Patient Instructions (Signed)
YOU HAD AN ENDOSCOPIC PROCEDURE TODAY AT [facility]: Refer to the procedure report that was given to you for any specific questions about what was found during the examination.  If the procedure report does not answer your questions, please call your gastroenterologist to clarify.  If you requested that your care partner not be given the details of your procedure findings, then the procedure report has been included in a sealed envelope for you to review at your convenience later.  YOU SHOULD EXPECT: Some feelings of bloating in the abdomen. Passage of more gas than usual.  Walking can help get rid of the air that was put into your GI tract during the procedure and reduce the bloating. If you had a lower endoscopy (such as a colonoscopy or flexible sigmoidoscopy) you may notice spotting of blood in your stool or on the toilet paper. If you underwent a bowel prep for your procedure, then you may not have a normal bowel movement for a few days.  DIET: Your first meal following the procedure should be a light meal and then it is ok to progress to your normal diet.  A half-sandwich or bowl of soup is an example of a good first meal.  Heavy or fried foods are harder to digest and may make you feel nauseous or bloated.  Likewise meals heavy in dairy and vegetables can cause extra gas to form and this can also increase the bloating.  Drink plenty of fluids but you should avoid alcoholic beverages for 24 hours.  ACTIVITY: Your care partner should take you home directly after the procedure.  You should plan to take it easy, moving slowly for the rest of the day.  You can resume normal activity the day after the procedure however you should NOT DRIVE or use heavy machinery for 24 hours (because of the sedation medicines used during the test).    SYMPTOMS TO REPORT IMMEDIATELY: A gastroenterologist can be reached at any hour.  During normal business hours, 8:30 AM to 5:00 PM Monday through Friday,  call [phone].  After hours and on weekends, please call the GI answering service at [phone] who will take a message and have the physician on call contact you.   Following lower endoscopy (colonoscopy or flexible sigmoidoscopy):  Excessive amounts of blood in the stool  Significant tenderness or worsening of abdominal pains  Swelling of the abdomen that is new, acute  Fever of 100F or higher    FOLLOW UP: If any biopsies were taken you will be contacted by phone or by letter within the next 1-3 weeks.  Call your gastroenterologist if you have not heard about the biopsies in 3 weeks.  Our staff will call the home number listed on your records the next business day following your procedure to check on you and address any questions or concerns that you may have at that time regarding the information given to you following your procedure. This is a courtesy call and so if there is no answer at the home number and we have not heard from you through the emergency physician on call, we will assume that you have returned to your regular daily activities without incident.  SIGNATURES/CONFIDENTIALITY: You and/or your care partner have signed paperwork which will be entered into your electronic medical record.  These signatures attest to the fact that that the information above on your After Visit Summary has been reviewed and is understood.  Full responsibility of the confidentiality  of this discharge information lies with you and/or your care-partner.   Polyp information given.  Dr. Henrene Pastor will advise you about recall colonoscopy after pathology reports are reviewed.

## 2013-08-25 NOTE — Progress Notes (Signed)
Called to room to assist during endoscopic procedure.  Patient ID and intended procedure confirmed with present staff. Received instructions for my participation in the procedure from the performing physician.  

## 2013-08-25 NOTE — Progress Notes (Signed)
Procedure ends, to recovery, report given and VSS. 

## 2013-08-26 ENCOUNTER — Telehealth: Payer: Self-pay | Admitting: *Deleted

## 2013-08-26 NOTE — Telephone Encounter (Signed)
No answer. Name identifier. Message left to call if questions or concerns.

## 2013-08-28 ENCOUNTER — Encounter: Payer: Self-pay | Admitting: Internal Medicine

## 2013-09-18 ENCOUNTER — Ambulatory Visit: Payer: BC Managed Care – PPO | Admitting: Family Medicine

## 2013-09-18 ENCOUNTER — Ambulatory Visit (INDEPENDENT_AMBULATORY_CARE_PROVIDER_SITE_OTHER): Payer: BC Managed Care – PPO | Admitting: Nurse Practitioner

## 2013-09-18 VITALS — BP 145/86 | HR 96 | Temp 98.8°F | Ht 60.0 in | Wt 155.0 lb

## 2013-09-18 DIAGNOSIS — M5136 Other intervertebral disc degeneration, lumbar region: Secondary | ICD-10-CM | POA: Insufficient documentation

## 2013-09-18 DIAGNOSIS — M545 Low back pain, unspecified: Secondary | ICD-10-CM

## 2013-09-18 DIAGNOSIS — M51369 Other intervertebral disc degeneration, lumbar region without mention of lumbar back pain or lower extremity pain: Secondary | ICD-10-CM | POA: Insufficient documentation

## 2013-09-18 MED ORDER — PREDNISONE 10 MG PO TABS: ORAL_TABLET | ORAL | Status: DC

## 2013-09-18 MED ORDER — METHYLPREDNISOLONE ACETATE 40 MG/ML IJ SUSP
40.0000 mg | Freq: Once | INTRAMUSCULAR | Status: AC
  Administered 2013-09-18: 40 mg via INTRAMUSCULAR

## 2013-09-18 NOTE — Patient Instructions (Addendum)
Please get xray. Start prednisone tomorrow. Ok to use aleve or ibuprophen OTC as directed. Use heating pad on lower back for 15-20 minutes several times dAILY. Avoid twisting trunk: golfers lift, let toes lead you when turning.  No lifting over 10 pounds for 2 weeks. When carrying objects, hold close to body to decrease strain. Avoid prolonged sitting-move around and do gentle back stretches throughout the day. Never force movement or stretch that hurts. Please follow up in 2 weeks. Our office will call with xray results.   Back Exercises Back exercises help treat and prevent back injuries. The goal of back exercises is to increase the strength of your abdominal and back muscles and the flexibility of your back. These exercises should be started when you no longer have back pain. Back exercises include:  Pelvic Tilt. Lie on your back with your knees bent. Tilt your pelvis until the lower part of your back is against the floor. Hold this position 5 to 10 sec and repeat 5 to 10 times.  Knee to Chest. Pull first 1 knee up against your chest and hold for 20 to 30 seconds, repeat this with the other knee, and then both knees. This may be done with the other leg straight or bent, whichever feels better.  Sit-Ups or Curl-Ups. Bend your knees 90 degrees. Start with tilting your pelvis, and do a partial, slow sit-up, lifting your trunk only 30 to 45 degrees off the floor. Take at least 2 to 3 seconds for each sit-up. Do not do sit-ups with your knees out straight. If partial sit-ups are difficult, simply do the above but with only tightening your abdominal muscles and holding it as directed.  Hip-Lift. Lie on your back with your knees flexed 90 degrees. Push down with your feet and shoulders as you raise your hips a couple inches off the floor; hold for 10 seconds, repeat 5 to 10 times.  Shoulder-Lifts. Lie face down with arms beside your body. Keep hips and torso pressed to floor as you slowly lift  your head and shoulders off the floor. Do not overdo your exercises, especially in the beginning. Exercises may cause you some mild back discomfort which lasts for a few minutes; however, if the pain is more severe, or lasts for more than 15 minutes, do not continue exercises until you see your caregiver. Improvement with exercise therapy for back problems is slow.  See your caregivers for assistance with developing a proper back exercise program. Document Released: 06/08/2004 Document Revised: 07/24/2011 Document Reviewed: 03/02/2011 [facility] Patient Information 2014 [place].

## 2013-09-18 NOTE — Progress Notes (Signed)
Pre visit review using our clinic review tool, if applicable. No additional management support is needed unless otherwise documented below in the visit note. 

## 2013-09-18 NOTE — Progress Notes (Signed)
Subjective:    Belinda Cox is a 54 y.o. female who presents for evaluation of low back pain. The patient has had recurrent self limited episodes of low back pain in the past. Symptoms have been present for 2 weeks and are stable.  Onset was related to / precipitated by no known injury. The pain is located in the lumbar region and radiates around to abdomen & up neck with twisting movement. The pain is described as aching and occurs intermittently and upon awakening. She rates her pain as mild to moderate. Symptoms are exacerbated by lying down and twisting. Symptoms are improved by leaning forward. she has not taken meds..  The following portions of the patient's history were reviewed and updated as appropriate: allergies, current medications, past medical history, past social history, past surgical history and problem list.  Review of Systems Pertinent items are noted in HPI.    Objective:   painful lateral flexion. No pain w/forward ot posterior flexion. Spinal tenderness over L-S at about l4. No adjacent spinal tenderness.    Assessment:  1. Recurrent low back pain Radiates up to neck & around to abdomen w/twisting motion. DD: Spondylosis, deg disc, muscle strain - DG Lumbar Spine 2-3 Views; Future - methylPREDNISolone acetate (DEPO-MEDROL) injection 40 mg; Inject 1 mL (40 mg total) into the muscle once. - predniSONE (DELTASONE) 10 MG tablet; Starting tomorrow, Take 4Tpo qam X 2d, then 3T po qam X 3d, then 2T po qd X 3d, then 1T po qam X 3d.  Dispense: 26 tablet; Refill: 0 Daily back stretches. NSAIDS F/u 2 weeks.

## 2013-09-19 ENCOUNTER — Ambulatory Visit (INDEPENDENT_AMBULATORY_CARE_PROVIDER_SITE_OTHER): Payer: BC Managed Care – PPO

## 2013-09-19 DIAGNOSIS — M5137 Other intervertebral disc degeneration, lumbosacral region: Secondary | ICD-10-CM

## 2013-09-19 DIAGNOSIS — M545 Low back pain, unspecified: Secondary | ICD-10-CM

## 2013-09-22 ENCOUNTER — Telehealth: Payer: Self-pay | Admitting: Nurse Practitioner

## 2013-09-22 NOTE — Telephone Encounter (Signed)
pls call pt: Advise Back xray shows mild degenerative disc disease. Usually this can be managed with daily stretches & exercises. I will discuss further at return appt. Is she feeling better w/prednisosne?

## 2013-09-23 NOTE — Telephone Encounter (Signed)
LMOVM for pt to return call 

## 2013-09-23 NOTE — Telephone Encounter (Signed)
Patient returned call and was given results. Patient stated that she is feeling great since starting prednisone.

## 2013-10-01 ENCOUNTER — Ambulatory Visit (INDEPENDENT_AMBULATORY_CARE_PROVIDER_SITE_OTHER): Payer: BC Managed Care – PPO | Admitting: Nurse Practitioner

## 2013-10-01 ENCOUNTER — Encounter: Payer: Self-pay | Admitting: Nurse Practitioner

## 2013-10-01 VITALS — BP 146/87 | HR 85 | Temp 98.2°F | Ht 60.0 in | Wt 157.0 lb

## 2013-10-01 DIAGNOSIS — M545 Low back pain, unspecified: Secondary | ICD-10-CM

## 2013-10-01 NOTE — Progress Notes (Signed)
Subjective:    Belinda Cox is a 53 y.o. female who presents for follow up of low back problems. Symptoms are relieved since taking prednisone taper. She is performing all ADLS. She has an occasional mild pain. Xray showed mild disc space narrowing at L5-S1.  The following portions of the patient's history were reviewed and updated as appropriate: allergies, current medications, past medical history, past social history, past surgical history and problem list.    Objective:    BP 146/87  Pulse 85  Temp(Src) 98.2 F (36.8 C) (Temporal)  Ht 5' (1.524 m)  Wt 157 lb (71.215 kg)  BMI 30.66 kg/m2  SpO2 97%  LMP 09/07/2012 General appearance: alert, cooperative, appears stated age and no distress Head: Normocephalic, without obvious abnormality, atraumatic Eyes: negative findings: lids and lashes normal and conjunctivae and sclerae normal Back: range of motion normal, normal gait.    Assessment:  1. Recurrent low back pain Mild disc space narrowing at L5-S1. Pain relieved w/pred taper. Discussed lifelong back maintenance. F/u PRN

## 2013-10-01 NOTE — Progress Notes (Signed)
Pre visit review using our clinic review tool, if applicable. No additional management support is needed unless otherwise documented below in the visit note. 

## 2013-10-01 NOTE — Assessment & Plan Note (Signed)
Pain resolved with prednisone taper. Discussed lifelong back maintenance. F/u PRN

## 2013-10-01 NOTE — Patient Instructions (Signed)
Your xray reveals mild disc space narrowing at L5-S1 and is likely the cause of the pain you had. Back maintenance will be key, lifelong: Daily back stretches and core strengthening will prevent flare-ups.  If you have another flare up, use heat several times daily, stretches, and anti-inflammatories like ibuprophen 400 to 600 mg 3 times day for several days. If no improvement, then you may need to see [place] for steroid treatment or back specialist referral.  Glad you are feeling better!  Degenerative Disk Disease Degenerative disk disease is a condition caused by the changes that occur in the cushions of the backbone (spinal disks) as you grow older. Spinal disks are soft and compressible disks located between the bones of the spine (vertebrae). They act like shock absorbers. Degenerative disk disease can affect the whole spine. However, the neck and lower back are most commonly affected. Many changes can occur in the spinal disks with aging, such as:  The spinal disks may dry and shrink.  Small tears may occur in the tough, outer covering of the disk (annulus).  The disk space may become smaller due to loss of water.  Abnormal growths in the bone (spurs) may occur. This can put pressure on the nerve roots exiting the spinal canal, causing pain.  The spinal canal may become narrowed. CAUSES  Degenerative disk disease is a condition caused by the changes that occur in the spinal disks with aging. The exact cause is not known, but there is a genetic basis for many patients. Degenerative changes can occur due to loss of fluid in the disk. This makes the disk thinner and reduces the space between the backbones. Small cracks can develop in the outer layer of the disk. This can lead to the breakdown of the disk. You are more likely to get degenerative disk disease if you are overweight. Smoking cigarettes and doing heavy work such as weightlifting can also increase your risk of this condition. Degenerative  changes can start after a sudden injury. Growth of bone spurs can compress the nerve roots and cause pain.  SYMPTOMS  The symptoms vary from person to person. Some people may have no pain, while others have severe pain. The pain may be so severe that it can limit your activities. The location of the pain depends on the part of your backbone that is affected. You will have neck or arm pain if a disk in the neck area is affected. You will have pain in your back, buttocks, or legs if a disk in the lower back is affected. The pain becomes worse while bending, reaching up, or with twisting movements. The pain may start gradually and then get worse as time passes. It may also start after a major or minor injury. You may feel numbness or tingling in the arms or legs.  DIAGNOSIS  Your caregiver will ask you about your symptoms and about activities or habits that may cause the pain. He or she may also ask about any injuries, diseases, or treatments you have had earlier. Your caregiver will examine you to check for the range of movement that is possible in the affected area, to check for strength in your extremities, and to check for sensation in the areas of the arms and legs supplied by different nerve roots. An X-ray of the spine may be taken. Your caregiver may suggest other imaging tests, such as magnetic resonance imaging (MRI), if needed.  TREATMENT  Treatment includes rest, modifying your activities, and applying ice  and heat. Your caregiver may prescribe medicines to reduce your pain and may ask you to do some exercises to strengthen your back. In some cases, you may need surgery. You and your caregiver will decide on the treatment that is best for you. HOME CARE INSTRUCTIONS   Follow proper lifting and walking techniques as advised by your caregiver.  Maintain good posture.  Exercise regularly as advised.  Perform relaxation exercises.  Change your sitting, standing, and sleeping habits as advised.  Change positions frequently.  Lose weight as advised.  Stop smoking if you smoke.  Wear supportive footwear. SEEK MEDICAL CARE IF:  Your pain does not go away within 1 to 4 weeks. SEEK IMMEDIATE MEDICAL CARE IF:   Your pain is severe.  You notice weakness in your arms, hands, or legs.  You begin to lose control of your bladder or bowel movements. MAKE SURE YOU:   Understand these instructions.  Will watch your condition.  Will get help right away if you are not doing well or get worse. Document Released: 02/26/2007 Document Revised: 07/24/2011 Document Reviewed: 02/26/2007 [facility] Patient Information 2014 [place].

## 2013-12-22 ENCOUNTER — Encounter: Payer: Self-pay | Admitting: Family Medicine

## 2013-12-22 ENCOUNTER — Ambulatory Visit (INDEPENDENT_AMBULATORY_CARE_PROVIDER_SITE_OTHER): Payer: BC Managed Care – PPO | Admitting: Family Medicine

## 2013-12-22 VITALS — BP 152/91 | HR 80 | Temp 98.3°F | Resp 18 | Ht 60.0 in | Wt 158.0 lb

## 2013-12-22 DIAGNOSIS — J018 Other acute sinusitis: Secondary | ICD-10-CM

## 2013-12-22 MED ORDER — AMOXICILLIN 875 MG PO TABS: 875.0000 mg | ORAL_TABLET | Freq: Two times a day (BID) | ORAL | Status: DC

## 2013-12-22 NOTE — Progress Notes (Signed)
Pre visit review using our clinic review tool, if applicable. No additional management support is needed unless otherwise documented below in the visit note. 

## 2013-12-22 NOTE — Patient Instructions (Signed)
Take alleve 2 tabs twice a day with food as needed for headache. Stop tylenol cold/sinus medication. Buy OTC nasal spray called nasocort and take 2 sprays in each nostril every morning. Buy OTC generic saline nasal spray, use 2-3 sprays in each nostril 3 times a day for irrigation and moisturization of your nasal passages.

## 2013-12-22 NOTE — Progress Notes (Signed)
OFFICE NOTE  12/22/2013  CC:  Chief Complaint  Patient presents with  . Nasal Congestion    x 1 week    HPI: Patient is a 53 y.o. Caucasian female who is here for "sinus" complaints.   Onset 1 week ago with congestion, runny nose, ST, temp 101.  Since then her sx's are "very congested in head/nose/sinuses" and all other sx's seem gone.  Lots of fatigue this illness.  Mild nausea occ, occ dizziness with moving too fast.  HA that is generalized.  No face pain or upper teeth pain. No nasal spray--says it caused nose bleeds and she can't recall the name of it. Taking tylenol sinus this week.  Says bp over the last week has been "130s over 90".  Pertinent PMH:  Allergic rhinitis HTN  PSH: none  MEDS:  Outpatient Prescriptions Prior to Visit  Medication Sig Dispense Refill  . hydrochlorothiazide (HYDRODIURIL) 25 MG tablet Take 1 tablet (25 mg total) by mouth daily.  30 tablet  12  . Multiple Vitamin (MULTIVITAMIN WITH MINERALS) TABS tablet Take 1 tablet by mouth daily.       No facility-administered medications prior to visit.    PE: Blood pressure 152/91, pulse 80, temperature 98.3 F (36.8 C), temperature source Temporal, resp. rate 18, height 5' (1.524 m), weight 158 lb (71.668 kg), last menstrual period 09/07/2012, SpO2 98.00%. VS: noted--normal. Gen: alert, NAD, NONTOXIC APPEARING. HEENT: eyes without injection, drainage, or swelling.  Ears: EACs clear, TMs with normal light reflex and landmarks.  Nose: Clear rhinorrhea, with some dried, crusty exudate adherent to mildly injected mucosa.  No purulent d/c.  No paranasal sinus TTP.  No facial swelling.  Throat and mouth without focal lesion.  No pharyngial swelling, erythema, or exudate.   Neck: supple, no LAD.   LUNGS: CTA bilat, nonlabored resps.   CV: RRR, no m/r/g. EXT: no c/c/e SKIN: no rash  IMPRESSION AND PLAN:  Prolonged URI, possibly acute bacterial sinusitis. Amoxil 881m bid x 10d. Try nasacort OTC spray qd. Add  saline nasal spray tid. Stop all OTC med with decongestant in it---it is likely affecting her bp control.  An After Visit Summary was printed and given to the patient.  FOLLOW UP: prn

## 2014-02-13 ENCOUNTER — Telehealth: Payer: Self-pay | Admitting: *Deleted

## 2014-02-13 NOTE — Telephone Encounter (Addendum)
Recall: 08 Hx of AGUS  Calling patient in regards to scheduling AEX Last AEX: 09/16/12 with Dr. Charlies Constable  Left Message To Call Back

## 2014-02-16 NOTE — Telephone Encounter (Signed)
Left Message To Call Back  

## 2014-02-23 NOTE — Telephone Encounter (Signed)
Left Message To Call Back  

## 2014-02-27 NOTE — Telephone Encounter (Signed)
Left Message To Call Back  

## 2014-03-03 NOTE — Telephone Encounter (Signed)
Called this patient x4 no call back please advise next step?

## 2014-03-11 NOTE — Telephone Encounter (Signed)
AGUS pap 12-2011 at previous MD with no further work up. NGYN and AEX 09-10-12. Pap ASCUS with positive HR HPV Colpo and endo BX 09-2012 , benign. 08 recall. What type of letter should we send since patient no responding?

## 2014-03-11 NOTE — Telephone Encounter (Signed)
Will send reminder letter. Letter to your office to sign Dr. Sabra Heck.  Ok to remove from recall?  CC: Gay Filler

## 2014-03-13 NOTE — Telephone Encounter (Signed)
Regular pap recall letter ok.  I have signed it.  AGUS pap was four years ago so this is now further than typical monitoring recommends.  Out of recall.  Encounter closed.

## 2014-03-16 ENCOUNTER — Encounter: Payer: Self-pay | Admitting: Family Medicine

## 2014-03-16 NOTE — Telephone Encounter (Signed)
Recall completed. Letter sent today. Gay Filler

## 2014-09-28 ENCOUNTER — Telehealth: Payer: Self-pay | Admitting: Family Medicine

## 2014-09-28 NOTE — Telephone Encounter (Signed)
LMOM for pt to CB.   Need to know if she's had mammogram or not?   Orders are in for [place].

## 2014-09-29 ENCOUNTER — Other Ambulatory Visit: Payer: Self-pay | Admitting: Family Medicine

## 2014-09-29 DIAGNOSIS — Z1231 Encounter for screening mammogram for malignant neoplasm of breast: Secondary | ICD-10-CM

## 2014-10-26 ENCOUNTER — Ambulatory Visit: Payer: Self-pay

## 2015-01-04 ENCOUNTER — Encounter: Payer: Self-pay | Admitting: Family Medicine

## 2015-01-04 ENCOUNTER — Ambulatory Visit (INDEPENDENT_AMBULATORY_CARE_PROVIDER_SITE_OTHER): Payer: BLUE CROSS/BLUE SHIELD | Admitting: Family Medicine

## 2015-01-04 VITALS — BP 143/88 | HR 86 | Temp 98.1°F | Resp 16 | Ht 60.0 in | Wt 150.0 lb

## 2015-01-04 DIAGNOSIS — M19041 Primary osteoarthritis, right hand: Secondary | ICD-10-CM | POA: Diagnosis not present

## 2015-01-04 DIAGNOSIS — I10 Essential (primary) hypertension: Secondary | ICD-10-CM

## 2015-01-04 DIAGNOSIS — M19042 Primary osteoarthritis, left hand: Secondary | ICD-10-CM | POA: Diagnosis not present

## 2015-01-04 MED ORDER — HYDROCHLOROTHIAZIDE 25 MG PO TABS: 25.0000 mg | ORAL_TABLET | Freq: Every day | ORAL | Status: DC

## 2015-01-04 NOTE — Progress Notes (Signed)
Pre visit review using our clinic review tool, if applicable. No additional management support is needed unless otherwise documented below in the visit note. 

## 2015-01-04 NOTE — Progress Notes (Signed)
OFFICE VISIT  01/04/2015   CC:  Chief Complaint  Patient presents with  . Hand Pain    both hands   HPI:    Patient is a 54 y.o. Caucasian female who presents for worsening pain in both hands.  Insidiously worseing, noting new knots on distal IP joints more the last month.  Stiff, sore are words she uses to describe.  She took 2 alleve once just to try it and it did help. No further doses taken.  At times feels it at base of R thumb and also in R elbow.  She is R handed. Only rarely has knee pain. No pain in feet/ankles/wrists/shoulders, hips, or back.  Also, has run out of bp med, says home bps 130s/80s usually.  It is a little high here today. No HA's, dizziness, or palpitations.  Past Medical History  Diagnosis Date  . Osteoarthritis     fingers  . Hypertension   . 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 Amenorrhea   . Blood transfusion without reported diagnosis     Birth  . Seasonal allergic rhinitis 09/10/2012    Past Surgical History  Procedure Laterality Date  . Unremarkable      Outpatient Prescriptions Prior to Visit  Medication Sig Dispense Refill  . amoxicillin (AMOXIL) 875 MG tablet Take 1 tablet (875 mg total) by mouth 2 (two) times daily. (Patient not taking: Reported on 01/04/2015) 20 tablet 0  . hydrochlorothiazide (HYDRODIURIL) 25 MG tablet Take 1 tablet (25 mg total) by mouth daily. (Patient not taking: Reported on 01/04/2015) 30 tablet 12  . Multiple Vitamin (MULTIVITAMIN WITH MINERALS) TABS tablet Take 1 tablet by mouth daily.     No facility-administered medications prior to visit.    No Known Allergies  ROS As per HPI  PE: Blood pressure 143/88, pulse 86, temperature 98.1 F (36.7 C), temperature source Oral, resp. rate 16, height 5' (1.524 m), weight 150 lb (68.04 kg), last menstrual period 09/07/2012, SpO2 97 %. Gen: Alert, well appearing.  Patient is oriented to  person, place, time, and situation. HANDS: hypertrophied and mildly nodular DIP joints on both hands.  Otherwise, remaining joints on both hands without swelling or erythema or tenderness.    LABS:  none  IMPRESSION AND PLAN:  1) Primary OA of PIPs of both hands.  Recommended OTC alleve 410m bid x 5d, then use this med bid prn. Therapeutic expectations and side effect profile of medication discussed today.  Patient's questions answered.  2) Essential HTN: RF'd bp med and told pt to restart it.   An After Visit Summary was printed and given to the patient.  FOLLOW UP: Return in about 6 months (around 07/07/2015) for f/u HTN.

## 2015-06-11 ENCOUNTER — Ambulatory Visit (INDEPENDENT_AMBULATORY_CARE_PROVIDER_SITE_OTHER): Payer: BLUE CROSS/BLUE SHIELD | Admitting: Family Medicine

## 2015-06-11 ENCOUNTER — Encounter: Payer: Self-pay | Admitting: Family Medicine

## 2015-06-11 VITALS — BP 136/88 | HR 92 | Temp 98.7°F | Resp 20 | Wt 160.8 lb

## 2015-06-11 DIAGNOSIS — J0100 Acute maxillary sinusitis, unspecified: Secondary | ICD-10-CM | POA: Diagnosis not present

## 2015-06-11 MED ORDER — AMOXICILLIN-POT CLAVULANATE 875-125 MG PO TABS: 1.0000 | ORAL_TABLET | Freq: Two times a day (BID) | ORAL | Status: DC

## 2015-06-11 NOTE — Progress Notes (Signed)
Patient ID: Belinda Cox, female   DOB: 29-Nov-1960, 55 y.o.   MRN: [id number]   Subjective:    Patient ID: Belinda Cox   DOB: 08-02-1960, 55 y.o.    MRN: [id number]  HPI  Nasal congestion: 2 weeks ago patient endorses nausea, dizziness, nasal congestion, ear pressure that has progressively worsened and now has facial sinus pressure. She denies fever, chills, current nausea, vomit or diarrhea. She is using nasal sprays only.   Past Medical History  Diagnosis Date  . Osteoarthritis     fingers  . Hypertension   . 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 Amenorrhea   . Blood transfusion without reported diagnosis     Birth  . Seasonal allergic rhinitis 09/10/2012   No Known Allergies Past Surgical History  Procedure Laterality Date  . Unremarkable     Social History  Substance Use Topics  . Smoking status: Never Smoker   . Smokeless tobacco: Never Used  . Alcohol Use: No    Review of Systems Negative, with the exception of above mentioned in HPI  Objective:   Physical Exam BP 136/88 mmHg  Pulse 92  Temp(Src) 98.7 F (37.1 C) (Oral)  Resp 20  Wt 160 lb 12 oz (72.916 kg)  SpO2 95%  LMP 09/07/2012 Body mass index is 31.39 kg/(m^2). Gen: Afebrile. No acute distress. Nontoxic in appearance. Well developed, well nourished HENT: AT. [place]. Bilateral TM visualized, right full, no erythema or bulging. MMM, no oral lesions. Bilateral nares with erythema ans swelling. Throat without erythema or  exudates. Cough and Hoarseness not present, TTP bilateral max sinus Eyes:Pupils Equal Round Reactive to light, Extraocular movements intact,  Conjunctiva without redness, discharge or icterus. Neck/lymp/endocrine: Supple, shotty ant cerv lymphadenopathy CV: RRR  Chest: CTAB, no wheeze or crackles Abd: Soft. NTND. BS present  Skin: no rashes, purpura or petechiae.  Assessment & Plan:  Belinda Cox is a 55  y.o. present for acute OV  1. Acute maxillary sinusitis, recurrence not specified - amoxicillin-clavulanate (AUGMENTIN) 875-125 MG tablet; Take 1 tablet by mouth 2 (two) times daily.  Dispense: 20 tablet; Refill: 0 - Augmentin, nasal spray , Humidifier, Mucinex

## 2015-06-11 NOTE — Patient Instructions (Signed)
Sinusitis, Adult Sinusitis is redness, soreness, and inflammation of the paranasal sinuses. Paranasal sinuses are air pockets within the bones of your face. They are located beneath your eyes, in the middle of your forehead, and above your eyes. In healthy paranasal sinuses, mucus is able to drain out, and air is able to circulate through them by way of your nose. However, when your paranasal sinuses are inflamed, mucus and air can become trapped. This can allow bacteria and other germs to grow and cause infection. Sinusitis can develop quickly and last only a short time (acute) or continue over a long period (chronic). Sinusitis that lasts for more than 12 weeks is considered chronic. CAUSES Causes of sinusitis include:  Allergies.  Structural abnormalities, such as displacement of the cartilage that separates your nostrils (deviated septum), which can decrease the air flow through your nose and sinuses and affect sinus drainage.  Functional abnormalities, such as when the small hairs (cilia) that line your sinuses and help remove mucus do not work properly or are not present. SIGNS AND SYMPTOMS Symptoms of acute and chronic sinusitis are the same. The primary symptoms are pain and pressure around the affected sinuses. Other symptoms include:  Upper toothache.  Earache.  Headache.  Bad breath.  Decreased sense of smell and taste.  A cough, which worsens when you are lying flat.  Fatigue.  Fever.  Thick drainage from your nose, which often is green and may contain pus (purulent).  Swelling and warmth over the affected sinuses. DIAGNOSIS Your health care provider will perform a physical exam. During your exam, your health care provider may perform any of the following to help determine if you have acute sinusitis or chronic sinusitis:  Look in your nose for signs of abnormal growths in your nostrils (nasal polyps).  Tap over the affected sinus to check for signs of  infection.  View the inside of your sinuses using an imaging device that has a light attached (endoscope). If your health care provider suspects that you have chronic sinusitis, one or more of the following tests may be recommended:  Allergy tests.  Nasal culture. A sample of mucus is taken from your nose, sent to a lab, and screened for bacteria.  Nasal cytology. A sample of mucus is taken from your nose and examined by your health care provider to determine if your sinusitis is related to an allergy. TREATMENT Most cases of acute sinusitis are related to a viral infection and will resolve on their own within 10 days. Sometimes, medicines are prescribed to help relieve symptoms of both acute and chronic sinusitis. These may include pain medicines, decongestants, nasal steroid sprays, or saline sprays. However, for sinusitis related to a bacterial infection, your health care provider will prescribe antibiotic medicines. These are medicines that will help kill the bacteria causing the infection. Rarely, sinusitis is caused by a fungal infection. In these cases, your health care provider will prescribe antifungal medicine. For some cases of chronic sinusitis, surgery is needed. Generally, these are cases in which sinusitis recurs more than 3 times per year, despite other treatments. HOME CARE INSTRUCTIONS  Drink plenty of water. Water helps thin the mucus so your sinuses can drain more easily.  Use a humidifier.  Inhale steam 3-4 times a day (for example, sit in the bathroom with the shower running).  Apply a warm, moist washcloth to your face 3-4 times a day, or as directed by your health care provider.  Use saline nasal sprays to help  moisten and clean your sinuses.  Take medicines only as directed by your health care provider.  If you were prescribed either an antibiotic or antifungal medicine, finish it all even if you start to feel better. SEEK IMMEDIATE MEDICAL CARE IF:  You have  increasing pain or severe headaches.  You have nausea, vomiting, or drowsiness.  You have swelling around your face.  You have vision problems.  You have a stiff neck.  You have difficulty breathing.   This information is not intended to replace advice given to you by your health care provider. Make sure you discuss any questions you have with your health care provider.   Document Released: 05/01/2005 Document Revised: 05/22/2014 Document Reviewed: 05/16/2011 Elsevier Interactive Patient Education 2016 [employer]  Nasal spray, mucinex (plain) Augmentin

## 2015-07-06 ENCOUNTER — Encounter: Payer: Self-pay | Admitting: Family Medicine

## 2015-07-06 ENCOUNTER — Ambulatory Visit (INDEPENDENT_AMBULATORY_CARE_PROVIDER_SITE_OTHER): Payer: BLUE CROSS/BLUE SHIELD | Admitting: Family Medicine

## 2015-07-06 DIAGNOSIS — J0100 Acute maxillary sinusitis, unspecified: Secondary | ICD-10-CM

## 2015-07-06 MED ORDER — AZITHROMYCIN 250 MG PO TABS: ORAL_TABLET | ORAL | Status: DC

## 2015-07-06 MED ORDER — PREDNISONE 20 MG PO TABS: 40.0000 mg | ORAL_TABLET | Freq: Every day | ORAL | Status: DC

## 2015-07-06 MED ORDER — BENZONATATE 200 MG PO CAPS: 200.0000 mg | ORAL_CAPSULE | Freq: Two times a day (BID) | ORAL | Status: DC | PRN

## 2015-07-06 NOTE — Progress Notes (Signed)
Patient ID: Belinda Cox, female   DOB: [birth year], 55 y.o.   MRN: [id number]    Belinda Cox , 12-01-1960, 55 y.o., female MRN: [id number]  CC: facial pain Subjective: Pt presents for an acute OV with complaints of headache, facial pain, fever (102) of 5 days duration.  Associated symptoms include cough, nausea, vomit. Pt feels she might of had the flu. 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 Pt has tried  to ease their symptoms.  Tdap UTD, flu declined  Pt was treated with Augmentin 2 weeks ago, with resolution of symptoms.   No Known Allergies Social History  Substance Use Topics  . Smoking status: Never Smoker   . Smokeless tobacco: Never Used  . Alcohol Use: No   Past Medical History  Diagnosis Date  . Osteoarthritis     fingers  . Hypertension   . 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 Amenorrhea   . Blood transfusion without reported diagnosis     Birth  . Seasonal allergic rhinitis 09/10/2012   Past Surgical History  Procedure Laterality Date  . Unremarkable     Family History  Problem Relation Age of Onset  . Alcoholism Maternal Grandfather   . Alcoholism Brother   . Arthritis Father     Alive  . Hypertension Father   . Heart disease Maternal Grandmother   . Diabetes Maternal Grandmother   . Cancer - Other Maternal Grandmother     Intestinal  . Pancreatic cancer Maternal Grandmother   . Hypertension Mother     Alive  . Diabetes Mother   . Diabetes Paternal Grandmother   . Leukemia Sister   . Colon cancer Neg Hx   . Rectal cancer Neg Hx   . Stomach cancer Neg Hx      Medication List       This list is accurate as of: 07/06/15  1:58 PM.  Always use your most recent med list.               hydrochlorothiazide 25 MG tablet  Commonly known as:  HYDRODIURIL  Take 1 tablet (25 mg total) by mouth daily.        ROS: Negative, with the exception of above mentioned in  HPI  Objective:  BP 141/91 mmHg  Pulse 88  Temp(Src) 98.8 F (37.1 C)  Resp 20  Wt 158 lb 8 oz (71.895 kg)  SpO2 96%  LMP 09/07/2012 Body mass index is 30.95 kg/(m^2). Gen: Afebrile. No acute distress. Nontoxic in appearance.  HENT: AT. [place]. Bilateral TM visualized and normal in appearance. MMM, no oral lesions. Bilateral nares mild erythema, no swelling. Throat without erythema or exudates. Mild cough and hoarseness on exam. TTP right x sinus.  Eyes:Pupils Equal Round Reactive to light, Extraocular movements intact,  Conjunctiva without redness, discharge or icterus. Neck/lymp/endocrine: Supple, shotty ant cervical  lymphadenopathy CV: RRR  Chest: CTAB, no wheeze or crackles. Good air movement, normal resp effort.  Abd: Soft. NTND. BS present  Skin: No rashes, purpura or petechiae.  Neuro: Normal gait. PERLA. EOMi. Alert. Oriented x3   Assessment/Plan: Sincere Berlanga is a 55 y.o. female present for acute OV for 1. Acute maxillary sinusitis, recurrence not specified - floanse, continue mucinex, tessalon perles. - azithromycin (ZITHROMAX) 250 MG tablet; 500 mg day 1 , then 250 mg daily  Dispense: 6 tablet; Refill: 0 - predniSONE (DELTASONE) 20 MG tablet; Take 2  tablets (40 mg total) by mouth daily with breakfast.  Dispense: 10 tablet; Refill: 0 - benzonatate (TESSALON) 200 MG capsule; Take 1 capsule (200 mg total) by mouth 2 (two) times daily as needed for cough.  Dispense: 20 capsule; Refill: 0   electronically signed by: Howard Pouch, DO  [place]

## 2015-07-06 NOTE — Patient Instructions (Signed)
Start flonase and continue OTC syrup you have. I have called in a z-pack, prednisone and tessalon perles (for cough).

## 2015-12-03 ENCOUNTER — Other Ambulatory Visit: Payer: Self-pay | Admitting: Internal Medicine

## 2015-12-03 ENCOUNTER — Other Ambulatory Visit: Payer: Self-pay | Admitting: Family Medicine

## 2015-12-03 DIAGNOSIS — Z1231 Encounter for screening mammogram for malignant neoplasm of breast: Secondary | ICD-10-CM

## 2015-12-09 ENCOUNTER — Ambulatory Visit
Admission: RE | Admit: 2015-12-09 | Discharge: 2015-12-09 | Disposition: A | Discharge status: Home / Self Care | Payer: BLUE CROSS/BLUE SHIELD | Source: Ambulatory Visit | Attending: Family Medicine | Admitting: Family Medicine

## 2015-12-09 DIAGNOSIS — Z1231 Encounter for screening mammogram for malignant neoplasm of breast: Secondary | ICD-10-CM

## 2015-12-16 ENCOUNTER — Ambulatory Visit (INDEPENDENT_AMBULATORY_CARE_PROVIDER_SITE_OTHER): Payer: BLUE CROSS/BLUE SHIELD | Admitting: Obstetrics and Gynecology

## 2015-12-16 ENCOUNTER — Encounter: Payer: Self-pay | Admitting: Obstetrics and Gynecology

## 2015-12-16 VITALS — BP 150/90 | HR 84 | Resp 14 | Ht 59.5 in | Wt 156.0 lb

## 2015-12-16 DIAGNOSIS — Z124 Encounter for screening for malignant neoplasm of cervix: Secondary | ICD-10-CM

## 2015-12-16 DIAGNOSIS — Z01419 Encounter for gynecological examination (general) (routine) without abnormal findings: Secondary | ICD-10-CM

## 2015-12-16 NOTE — Patient Instructions (Signed)

## 2015-12-16 NOTE — Progress Notes (Signed)
55 y.o. Belinda Cox MarriedCaucasianF here for annual exam.  No bleeding. Sexually active, no pain. Mild night sweats, no hot flashes or vaginal dryness.  Mom just passed from uterine cancer at 17.  She forgot to take her BP medication this morning.  She c/o a few episodes of waking up in the morning with mid lower back pain, sometimes radiates to the front. Usually after she voids the pain resolves after about 30 minutes. She has degenerate disc disease. Happened years in the past and went away, then a couple of times in the last week.     Patient's last menstrual period was 09/07/2012.          Sexually active: Yes.    The current method of family planning is post menopausal status.    Exercising: No.  The patient does not participate in regular exercise at present. Smoker:  no  Health Maintenance: Pap:  09-16-12 ASCUS Positive HR HPV colposcopy done- benign History of abnormal Pap:  yes MMG:  12-10-15 WNL Colonoscopy:  08-25-13 polyps  Repeat in 5 years BMD:   Never TDaP:  08-11-13 Gardasil: N/A   reports that she has never smoked. She has never used smokeless tobacco. 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 No grandchildren.   Past Medical History:  Diagnosis Date  . Amenorrhea   . Blood transfusion without reported diagnosis    Birth  . 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 Hypertension   . Osteoarthritis    fingers  . Seasonal allergic rhinitis 09/10/2012    Past Surgical History:  Procedure Laterality Date  . COLPOSCOPY    . Unremarkable      Current Outpatient Prescriptions  Medication Sig Dispense Refill  . cetirizine (ZYRTEC) 10 MG tablet Take 10 mg by mouth daily.    . hydrochlorothiazide (HYDRODIURIL) 25 MG tablet Take 1 tablet (25 mg total) by mouth daily. 30 tablet 12   No current facility-administered medications for this visit.      Family History  Problem Relation Age of Onset  . Alcoholism Maternal Grandfather   . Alcoholism Brother   . Arthritis Father     Alive  . Hypertension Father   . Heart disease Maternal Grandmother   . Diabetes Maternal Grandmother   . Cancer - Other Maternal Grandmother     Intestinal  . Pancreatic cancer Maternal Grandmother   . Hypertension Mother     Alive  . Diabetes Mother   . Cancer Mother   . Uterine cancer Mother   . Diabetes Paternal Grandmother   . Leukemia Sister   . Colon cancer Neg Hx   . Rectal cancer Neg Hx   . Stomach cancer Neg Hx     Review of Systems  Constitutional: Negative.   HENT: Negative.   Eyes: Negative.   Respiratory: Negative.   Cardiovascular: Negative.   Gastrointestinal: Negative.   Endocrine: Negative.   Genitourinary: Negative.   Musculoskeletal: Negative.   Skin: Negative.   Allergic/Immunologic: Negative.   Neurological: Negative.   Psychiatric/Behavioral: Negative.     Exam:   BP (!) 150/90 (BP Location: Right Arm, Patient Position: Sitting, Cuff Size: Normal)   Pulse 84   Resp 14   Ht 4' 11.5" (1.511 m)   Wt 156 lb (70.8 kg)   LMP 09/07/2012   BMI 30.98 kg/m  Weight change: @WEIGHTCHANGE @ Height:   Height: 4' 11.5" (151.1 cm)  Ht Readings from Last 3 Encounters:  12/16/15 4' 11.5" (1.511 m)  01/04/15 5' (1.524 m)  12/22/13 5' (1.524 m)    General appearance: alert, cooperative and appears stated age Head: Normocephalic, without obvious abnormality, atraumatic Neck: no adenopathy, supple, symmetrical, trachea midline and thyroid normal to inspection and palpation Lungs: clear to auscultation bilaterally Breasts: normal appearance, no masses or tenderness Heart: regular rate and rhythm Abdomen: soft, non-tender; bowel sounds normal; no masses,  no organomegaly Extremities: extremities normal, atraumatic, no cyanosis or edema Skin: Skin color, texture, turgor normal. No rashes or lesions Lymph nodes: Cervical,  supraclavicular, and axillary nodes normal. No abnormal inguinal nodes palpated Neurologic: Grossly normal   Pelvic: External genitalia:  no lesions              Urethra:  normal appearing urethra with no masses, tenderness or lesions              Bartholins and Skenes: normal                 Vagina: normal appearing atrophic vagina with normal color and discharge, no lesions              Cervix: no lesions               Bimanual Exam:  Uterus:  normal size, contour, position, consistency, mobility, non-tender              Adnexa: no mass, fullness, tenderness               Rectovaginal: Confirms               Anus:  normal sphincter tone, no lesions  Chaperone was present for exam.  A:  Well Woman with normal exam  H/O abnormal pap, overdue for f/u  P:   Pap with hpv  Mammogram and colonoscopy are UTD  Labs and immunizations with primary   Recommend she take a calcium supplement with vit d  Discussed breast self awareness

## 2015-12-20 LAB — IPS PAP TEST WITH HPV

## 2015-12-27 ENCOUNTER — Encounter: Payer: Self-pay | Admitting: Family Medicine

## 2016-01-27 ENCOUNTER — Other Ambulatory Visit: Payer: Self-pay | Admitting: Family Medicine

## 2016-01-27 MED ORDER — HYDROCHLOROTHIAZIDE 25 MG PO TABS: 25.0000 mg | ORAL_TABLET | Freq: Every day | ORAL | 0 refills | Status: DC

## 2016-01-27 NOTE — Telephone Encounter (Signed)
Left message on pt's phone stating that her RX has been sent into office #[id number] but she will need to follow up with Dr Jerilynn Mages before getting any further refills.  She was supposed to f/u in 6 months from her August, 2016 appointment.

## 2016-02-09 ENCOUNTER — Encounter: Payer: Self-pay | Admitting: Family Medicine

## 2016-02-09 ENCOUNTER — Ambulatory Visit (INDEPENDENT_AMBULATORY_CARE_PROVIDER_SITE_OTHER): Payer: BLUE CROSS/BLUE SHIELD | Admitting: Family Medicine

## 2016-02-09 VITALS — BP 143/86 | HR 88 | Temp 98.2°F | Resp 14 | Wt 157.0 lb

## 2016-02-09 DIAGNOSIS — M7672 Peroneal tendinitis, left leg: Secondary | ICD-10-CM

## 2016-02-09 DIAGNOSIS — Z23 Encounter for immunization: Secondary | ICD-10-CM | POA: Diagnosis not present

## 2016-02-09 DIAGNOSIS — Z114 Encounter for screening for human immunodeficiency virus [HIV]: Secondary | ICD-10-CM | POA: Diagnosis not present

## 2016-02-09 DIAGNOSIS — I10 Essential (primary) hypertension: Secondary | ICD-10-CM

## 2016-02-09 MED ORDER — METOPROLOL SUCCINATE ER 25 MG PO TB24: 25.0000 mg | ORAL_TABLET | Freq: Every day | ORAL | 1 refills | Status: DC

## 2016-02-09 MED ORDER — DICLOFENAC SODIUM 75 MG PO TBEC: 75.0000 mg | DELAYED_RELEASE_TABLET | Freq: Two times a day (BID) | ORAL | 0 refills | Status: DC

## 2016-02-09 NOTE — Progress Notes (Signed)
OFFICE VISIT  02/09/2016   CC:  Chief Complaint  Patient presents with  . Follow-up    HTN,Left ankle pain     HPI:    Patient is a 55 y.o. Caucasian female who presents for f/u HTN.  I last saw her about 13 mo ago and at that time I had restarted her bp med. Avg bp when checked at home is 140s over 80s.  Compliant with hctz. No otc decongestants.  Caffeine 2 cups coffee qAM. 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  Hurting in lateral aspect of L ankle x 2 wks, symptoms wax and wane to the point that some days it doesn't bother her at all (like today).  No recollection of a twist injury or any trauma. Occ feeling of "nerves fluttering" on bottom of L foot, lasts a few seconds only. No swelling or redness noted.  No meds taken for it.   Past Medical History:  Diagnosis Date  . Amenorrhea   . Blood transfusion without reported diagnosis    Birth  . History of abnormal Pap smear    AGUS; ASCUS, with high risk HPV+, (colpo neg 09/2012--repeat pap 12/16/15 nl, no HR HPV--per GYN needs pap and HR HPV testing again in 3 yrs))  . Hypertension   . Osteoarthritis    fingers  . Seasonal allergic rhinitis 09/10/2012    Past Surgical History:  Procedure Laterality Date  . COLONOSCOPY W/ POLYPECTOMY  08/2013   Recall 5 yrs  . COLPOSCOPY    . Unremarkable      Outpatient Medications Prior to Visit  Medication Sig Dispense Refill  . cetirizine (ZYRTEC) 10 MG tablet Take 10 mg by mouth daily.    . hydrochlorothiazide (HYDRODIURIL) 25 MG tablet Take 1 tablet (25 mg total) by mouth daily. 30 tablet 0   No facility-administered medications prior to visit.     No Known Allergies  ROS As per HPI  PE: Blood pressure (!) 143/86, pulse 88, temperature 98.2 F (36.8 C), temperature source Oral, resp. rate 14, weight 157 lb (71.2 kg), last menstrual period 09/07/2012, SpO2 95 %. Gen: Alert, well appearing.  Patient is oriented to person, place, time, and situation. CV: RRR, no  m/r/g.   LUNGS: CTA bilat, nonlabored resps, good aeration in all lung fields. EXT: no clubbing, cyanosis, or edema.  Left ankle with no swelling, erythema, warmth, or tenderness to palpation. She shows me the area of most tenderness when it occurs is inferior to lateral malleolus. Talar tilt normal.  Anterior drawer normal.  LABS:    Chemistry      Component Value Date/Time   NA 138 08/11/2013 1504   K 3.6 08/11/2013 1504   CL 101 08/11/2013 1504   CO2 28 08/11/2013 1504   BUN 14 08/11/2013 1504   CREATININE 0.9 08/11/2013 1504      Component Value Date/Time   CALCIUM 9.5 08/11/2013 1504   ALKPHOS 97 08/11/2013 1504   AST 38 (H) 08/11/2013 1504   ALT 85 (H) 08/11/2013 1504   BILITOT 0.5 08/11/2013 1504       IMPRESSION AND PLAN:  1) Hypertension, not ideal control. Continue HCTZ 3m qd. Add toprol xl generic 268mqd. Check BMET today. Continue home bp monitoring.  2) Left ankle pain, suspect peroneal tendonitis. She has already been trying a lace-up ankle support intermittently and this actually hurts the top of her left foot. Will try walking boot.  Rx'd diclofenac 7519mid x 10d.  Flu vaccine today.  An After Visit Summary was printed and given to the patient.  FOLLOW UP: Return in about 4 weeks (around 03/08/2016) for f/u HTN and ankle pain.  Signed:  Crissie Sickles, MD           02/09/2016

## 2016-02-09 NOTE — Addendum Note (Signed)
Addended by: Gordy Councilman on: 02/09/2016 04:09 PM   Modules accepted: Orders

## 2016-02-09 NOTE — Progress Notes (Signed)
Pre visit review using our clinic review tool, if applicable. No additional management support is needed unless otherwise documented below in the visit note. 

## 2016-02-10 ENCOUNTER — Encounter: Payer: Self-pay | Admitting: Family Medicine

## 2016-02-10 LAB — BASIC METABOLIC PANEL
BUN: 19 mg/dL (ref 6–23)
CHLORIDE: 100 meq/L (ref 96–112)
CO2: 31 mEq/L (ref 19–32)
Calcium: 9.7 mg/dL (ref 8.4–10.5)
Creatinine, Ser: 0.91 mg/dL (ref 0.40–1.20)
GFR: 67.95 mL/min (ref >60.00)
GLUCOSE: 89 mg/dL (ref 70–99)
POTASSIUM: 4 meq/L (ref 3.5–5.1)
SODIUM: 141 meq/L (ref 135–145)

## 2016-02-10 LAB — HIV ANTIBODY (ROUTINE TESTING W REFLEX): HIV 1&2 Ab, 4th Generation: NONREACTIVE

## 2016-02-28 ENCOUNTER — Other Ambulatory Visit: Payer: Self-pay | Admitting: Family Medicine

## 2016-03-09 ENCOUNTER — Encounter: Payer: Self-pay | Admitting: Family Medicine

## 2016-03-09 ENCOUNTER — Ambulatory Visit (INDEPENDENT_AMBULATORY_CARE_PROVIDER_SITE_OTHER): Payer: BLUE CROSS/BLUE SHIELD | Admitting: Family Medicine

## 2016-03-09 VITALS — BP 130/88 | HR 85 | Temp 97.6°F | Resp 18 | Wt 155.1 lb

## 2016-03-09 DIAGNOSIS — M7742 Metatarsalgia, left foot: Secondary | ICD-10-CM | POA: Diagnosis not present

## 2016-03-09 DIAGNOSIS — I10 Essential (primary) hypertension: Secondary | ICD-10-CM

## 2016-03-09 MED ORDER — DICLOFENAC SODIUM 75 MG PO TBEC: 75.0000 mg | DELAYED_RELEASE_TABLET | Freq: Two times a day (BID) | ORAL | 0 refills | Status: DC

## 2016-03-09 MED ORDER — HYDROCHLOROTHIAZIDE 25 MG PO TABS: 25.0000 mg | ORAL_TABLET | Freq: Every day | ORAL | 3 refills | Status: DC

## 2016-03-09 NOTE — Progress Notes (Signed)
OFFICE VISIT  03/09/2016   CC:  Chief Complaint  Patient presents with  . Follow-up    HTN   HPI:    Patient is a 55 y.o. Caucasian female who presents for 1 mo f/u HTN and left ankle tendonitis. Added toprol xl 23m qd last visit. Home bp monitoring consistently < 140/90.    Says left ankle/foot is much improved.  She has been wearing the boot 4-6 hours per day, took 10d of diclofenac as instructed.  Now just has some pain in distal metatarsal region intermitently--"sore". 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  Past Medical History:  Diagnosis Date  . Amenorrhea   . Blood transfusion without reported diagnosis    Birth  . Chronic renal insufficiency, stage II (mild)    GFR 60s  . History of abnormal Pap smear    AGUS; ASCUS, with high risk HPV+, (colpo neg 09/2012--repeat pap 12/16/15 nl, no HR HPV--per GYN needs pap and HR HPV testing again in 3 yrs))  . Hypertension   . Osteoarthritis    fingers  . Seasonal allergic rhinitis 09/10/2012    Past Surgical History:  Procedure Laterality Date  . COLONOSCOPY W/ POLYPECTOMY  08/2013   Recall 5 yrs  . COLPOSCOPY    . Unremarkable      Outpatient Medications Prior to Visit  Medication Sig Dispense Refill  . cetirizine (ZYRTEC) 10 MG tablet Take 10 mg by mouth daily.    . metoprolol succinate (TOPROL-XL) 25 MG 24 hr tablet Take 1 tablet (25 mg total) by mouth daily. 30 tablet 1  . diclofenac (VOLTAREN) 75 MG EC tablet Take 1 tablet (75 mg total) by mouth 2 (two) times daily. 20 tablet 0  . hydrochlorothiazide (HYDRODIURIL) 25 MG tablet TAKE 1 TABLET (25 MG TOTAL) BY MOUTH DAILY. 30 tablet 0   No facility-administered medications prior to visit.     No Known Allergies  ROS As per HPI  PE: Blood pressure 130/88, pulse 85, temperature 97.6 F (36.4 C), temperature source Temporal, resp. rate 18, weight 155 lb 1.9 oz (70.4 kg), last menstrual period 09/07/2012, SpO2 98 %. Gen: Alert, well appearing.   Patient is oriented to person, place, time, and situation. L ankle and foot nontender and with full ROM without pain or stiffness. Mild "soreness" to touch along medial aspect of dorsum of foot. Mild tenderness over metatarsal heads 2-4 on L foot.    LABS:    Chemistry      Component Value Date/Time   NA 141 02/09/2016 1546   K 4.0 02/09/2016 1546   CL 100 02/09/2016 1546   CO2 31 02/09/2016 1546   BUN 19 02/09/2016 1546   CREATININE 0.91 02/09/2016 1546      Component Value Date/Time   CALCIUM 9.7 02/09/2016 1546   ALKPHOS 97 08/11/2013 1504   AST 38 (H) 08/11/2013 1504   ALT 85 (H) 08/11/2013 1504   BILITOT 0.5 08/11/2013 1504       IMPRESSION AND PLAN:  1) HTN: The current medical regimen is effective;  continue present plan and medications. RF'd hctz today.  2) Foot tendonitis resolving.  Now with some metatarsalgia. Plan: Decrease time in boot to 2 hours per day. In 1 week, cut back time in boot by 15 min each day until you are out of boot all the time. Take another 10d of diclofenac 752mbid. Buy prefabricated/otc metatarsal pad and insert in left shoe.  An After Visit  Summary was printed and given to the patient.  FOLLOW UP: Return in about 6 months (around 09/07/2016) for annual CPE (fasting).  Signed:  Crissie Sickles, MD           03/09/2016

## 2016-03-09 NOTE — Progress Notes (Signed)
Pre visit review using our clinic review tool, if applicable. No additional management support is needed unless otherwise documented below in the visit note. 

## 2016-03-09 NOTE — Patient Instructions (Signed)
Decrease time in boot to 2 hours per day. In 1 week, cut back time in boot by 15 min each day until you are out of boot all the time.  Buy prefabricated/otc metatarsal pad and insert in left shoe.

## 2016-04-06 ENCOUNTER — Other Ambulatory Visit: Payer: Self-pay | Admitting: Family Medicine

## 2016-04-10 NOTE — Telephone Encounter (Signed)
RF request for metoprolol LOV: 03/09/16 Next ov: None Last written: 02/09/16 #30 w/ 1RF

## 2016-05-24 ENCOUNTER — Encounter: Payer: Self-pay | Admitting: Podiatry

## 2016-05-24 ENCOUNTER — Ambulatory Visit (INDEPENDENT_AMBULATORY_CARE_PROVIDER_SITE_OTHER): Payer: BLUE CROSS/BLUE SHIELD

## 2016-05-24 ENCOUNTER — Ambulatory Visit (INDEPENDENT_AMBULATORY_CARE_PROVIDER_SITE_OTHER): Payer: BLUE CROSS/BLUE SHIELD | Admitting: Podiatry

## 2016-05-24 VITALS — HR 87 | Resp 16 | Ht 59.0 in | Wt 155.0 lb

## 2016-05-24 DIAGNOSIS — M79672 Pain in left foot: Secondary | ICD-10-CM

## 2016-05-24 DIAGNOSIS — M779 Enthesopathy, unspecified: Secondary | ICD-10-CM

## 2016-05-24 DIAGNOSIS — M25572 Pain in left ankle and joints of left foot: Secondary | ICD-10-CM | POA: Diagnosis not present

## 2016-05-24 DIAGNOSIS — D3610 Benign neoplasm of peripheral nerves and autonomic nervous system, unspecified: Secondary | ICD-10-CM

## 2016-05-24 MED ORDER — DICLOFENAC SODIUM 75 MG PO TBEC: 75.0000 mg | DELAYED_RELEASE_TABLET | Freq: Two times a day (BID) | ORAL | 2 refills | Status: DC

## 2016-05-24 MED ORDER — TRIAMCINOLONE ACETONIDE 10 MG/ML IJ SUSP
10.0000 mg | Freq: Once | INTRAMUSCULAR | Status: AC
  Administered 2016-05-24: 10 mg

## 2016-05-24 NOTE — Progress Notes (Signed)
Subjective:     Patient ID: Belinda Cox, female   DOB: Feb 06, 1961, 56 y.o.   MRN: [id number]  HPI patient presents with pain in the ankle joint left and also possibility for nerve irritation left that was secondary to the ankle pain   Review of Systems  All other systems reviewed and are negative.      Objective:   Physical Exam  Constitutional: She is oriented to person, place, and time.  Cardiovascular: Intact distal pulses.   Musculoskeletal: Normal range of motion.  Neurological: She is oriented to person, place, and time.  Skin: Skin is warm.  Nursing note and vitals reviewed.  neurovascular status found to be intact muscle strength was adequate range of motion within normal limits with patient found to have inflammatory changes of the subtalar joint left with fluid buildup within the joint and mild medial discomfort. There is also a positive Mulder sign left with shooting pain third interspace when percussed     Assessment:     Probability for sinus tarsitis left with ankle pain creating change in gait and leading to forefoot discomfort and flaring of nerve    Plan:     H&P conditions reviewed and careful sinus tarsi injection left administered 3 mg Kenalog 5 mg Xylocaine and then advised the neuroma may need to be treated in future. Applied fascial brace to open up the sinus tarsi and instructed on physical therapy and reappoint to recheck  X-ray indicates that there is no signs of arthritis and there is inflammatory process is occurring and no indications of stress fracture

## 2016-05-24 NOTE — Progress Notes (Signed)
   Subjective:    Patient ID: Belinda Cox, female    DOB: 14-May-1961, 56 y.o.   MRN: [id number]  HPI  Chief Complaint  Patient presents with  . Foot Pain    Left; Plantar Forefoot, beneath 4th and 5th toes x 2.5 months.   . Ankle Pain    Left, Medial and Lateral side x 2.5 months. Pt stated 'the pain comes and goes, was dx with tendonitis by PCP, wore a boot for a month, it helped for about a month"  Pain worsens when she puts pressure on the foot.        Review of Systems     Objective:   Physical Exam        Assessment & Plan:

## 2016-06-07 ENCOUNTER — Encounter: Payer: Self-pay | Admitting: Podiatry

## 2016-06-07 ENCOUNTER — Ambulatory Visit (INDEPENDENT_AMBULATORY_CARE_PROVIDER_SITE_OTHER): Payer: BLUE CROSS/BLUE SHIELD | Admitting: Podiatry

## 2016-06-07 DIAGNOSIS — M779 Enthesopathy, unspecified: Secondary | ICD-10-CM | POA: Diagnosis not present

## 2016-06-07 DIAGNOSIS — D3610 Benign neoplasm of peripheral nerves and autonomic nervous system, unspecified: Secondary | ICD-10-CM | POA: Diagnosis not present

## 2016-06-07 NOTE — Progress Notes (Signed)
Subjective:     Patient ID: Belinda Cox, female   DOB: March 05, 1961, 56 y.o.   MRN: [id number]  HPI patient presents with continued pain in the right foot stating that she gets shooting discomfort and inability to walk comfortably   Review of Systems     Objective:   Physical Exam Probable neuroma symptomatology right failure to respond to previous conservative treatment    Assessment:     Neuroma symptoms with possible capsulitis    Plan:     Reviewed condition and consideration this may require surgical intervention. Continue anti-inflammatory continue wider shoe gear and if symptoms persist we'll need to consider more aggressive approach

## 2016-06-07 NOTE — Patient Instructions (Signed)
Morton Neuralgia Introduction [place] Lathe neuralgia is a type of foot pain in the area closest to your toes. This area is sometimes called the ball of your foot. Morton neuralgia occurs when a branch of a nerve in your foot (digital nerve) becomes compressed. When this happens over a long period of time, the nerve can thicken (neuroma) and cause pain. This usually occurs between the third and fourth toe. Morton neuralgia can come and go but may get worse over time. What are the causes? Your digital nerve can become compressed and stretched at a point where it passes under a thick band of tissue that connects your toes (intermetatarsal ligament). Morton neuralgia can be caused by mild repetitive damage in this area. This type of damage can result from:  Activities such as running or jumping.  Wearing shoes that are too tight. What increases the risk? You may be at risk for Morton neuralgia if you:  Are female.  Wear high heels.  Wear shoes that are narrow or tight.  Participate in activities that stretch your toes. These include:  Running.  Ballet.  Long-distance walking. What are the signs or symptoms? The first symptom of Morton neuralgia is pain that spreads from the ball of your foot to your toes. It may feel like you are walking on a marble. Pain usually gets worse with walking and goes away at night. Other symptoms may include numbness and cramping of your toes. How is this diagnosed? Your health care provider will do a physical exam. When doing the exam, your health care provider may:  Squeeze your foot just behind your toe.  Ask you to move your toes to check for pain. You may also have tests on your foot to confirm the diagnosis. These may include:  An X-ray.  An MRI. How is this treated? Treatment for Morton neuralgia may be as simple as changing the kind of shoes you wear. Other treatments may include:  Wearing a supportive pad (orthosis) under the front of your foot.  This lifts your toe bones and takes pressure off the nerve.  Getting injections of numbing medicine and anti-inflammatory medicine (steroid) in the nerve.  Having surgery to remove part of the thickened nerve. Follow these instructions at home:  Take medicine only as directed by your health care provider.  Wear soft-soled shoes with a wide toe area.  Stop activities that may be causing pain.  Elevate your foot when resting.  Massage your foot.  Apply ice to the injured area:  Put ice in a plastic bag.  Place a towel between your skin and the bag.  Leave the ice on for 20 minutes, 2-3 times a day.  Keep all follow-up visits as directed by your health care provider. This is important. Contact a health care provider if:  Home care instructions are not helping you get better.  Your symptoms change or get worse. This information is not intended to replace advice given to you by your health care provider. Make sure you discuss any questions you have with your health care provider. Document Released: 08/07/2000 Document Revised: 10/07/2015 Document Reviewed: 07/02/2013  2017 [employer]vier

## 2016-06-13 ENCOUNTER — Encounter: Payer: Self-pay | Admitting: Family Medicine

## 2016-07-13 DIAGNOSIS — R7301 Impaired fasting glucose: Secondary | ICD-10-CM

## 2016-07-13 HISTORY — DX: Impaired fasting glucose: R73.01

## 2016-07-27 ENCOUNTER — Encounter: Payer: Self-pay | Admitting: Family Medicine

## 2016-07-27 ENCOUNTER — Ambulatory Visit (INDEPENDENT_AMBULATORY_CARE_PROVIDER_SITE_OTHER): Payer: BLUE CROSS/BLUE SHIELD | Admitting: Family Medicine

## 2016-07-27 ENCOUNTER — Encounter: Payer: Self-pay | Admitting: *Deleted

## 2016-07-27 VITALS — BP 109/83 | HR 95 | Temp 98.5°F | Resp 16 | Ht 59.0 in | Wt 165.2 lb

## 2016-07-27 DIAGNOSIS — R000 Tachycardia, unspecified: Secondary | ICD-10-CM | POA: Diagnosis not present

## 2016-07-27 DIAGNOSIS — B349 Viral infection, unspecified: Secondary | ICD-10-CM

## 2016-07-27 DIAGNOSIS — R5383 Other fatigue: Secondary | ICD-10-CM | POA: Diagnosis not present

## 2016-07-27 DIAGNOSIS — I10 Essential (primary) hypertension: Secondary | ICD-10-CM

## 2016-07-27 DIAGNOSIS — R030 Elevated blood-pressure reading, without diagnosis of hypertension: Secondary | ICD-10-CM

## 2016-07-27 LAB — CBC WITH DIFFERENTIAL/PLATELET
BASOS ABS: 0.1 10*3/uL (ref 0.0–0.1)
BASOS PCT: 0.9 % (ref 0.0–3.0)
EOS ABS: 0.1 10*3/uL (ref 0.0–0.7)
Eosinophils Relative: 1.8 % (ref 0.0–5.0)
HEMATOCRIT: 39.6 % (ref 36.0–46.0)
HEMOGLOBIN: 13.7 g/dL (ref 12.0–15.0)
LYMPHS PCT: 38 % (ref 12.0–46.0)
Lymphs Abs: 2.9 10*3/uL (ref 0.7–4.0)
MCHC: 34.6 g/dL (ref 30.0–36.0)
MCV: 90.8 fl (ref 78.0–100.0)
Monocytes Absolute: 1.1 10*3/uL — ABNORMAL HIGH (ref 0.1–1.0)
Monocytes Relative: 14.5 % — ABNORMAL HIGH (ref 3.0–12.0)
Neutro Abs: 3.4 10*3/uL (ref 1.4–7.7)
Neutrophils Relative %: 44.8 % (ref 43.0–77.0)
Platelets: 392 10*3/uL (ref 150.0–400.0)
RBC: 4.35 Mil/uL (ref 3.87–5.11)
RDW: 13.4 % (ref 11.5–15.5)
WBC: 7.6 10*3/uL (ref 4.0–10.5)

## 2016-07-27 LAB — COMPREHENSIVE METABOLIC PANEL
ALBUMIN: 4.3 g/dL (ref 3.5–5.2)
ALK PHOS: 92 U/L (ref 39–117)
ALT: 62 U/L — AB (ref 0–35)
AST: 31 U/L (ref 0–37)
BUN: 22 mg/dL (ref 6–23)
CO2: 31 mEq/L (ref 19–32)
Calcium: 9.6 mg/dL (ref 8.4–10.5)
Chloride: 96 mEq/L (ref 96–112)
Creatinine, Ser: 1.32 mg/dL — ABNORMAL HIGH (ref 0.40–1.20)
GFR: 44.16 mL/min — ABNORMAL LOW (ref >60.00)
GLUCOSE: 82 mg/dL (ref 70–99)
Potassium: 3.5 mEq/L (ref 3.5–5.1)
SODIUM: 139 meq/L (ref 135–145)
TOTAL PROTEIN: 7.4 g/dL (ref 6.0–8.3)
Total Bilirubin: 0.5 mg/dL (ref 0.2–1.2)

## 2016-07-27 LAB — TSH: TSH: 4.55 u[IU]/mL — AB (ref 0.35–4.50)

## 2016-07-27 NOTE — Progress Notes (Signed)
Pre visit review using our clinic review tool, if applicable. No additional management support is needed unless otherwise documented below in the visit note. 

## 2016-07-27 NOTE — Progress Notes (Signed)
OFFICE VISIT  07/27/2016   CC:  Chief Complaint  Patient presents with  . Follow-up    HTN, per pt BP and HR was [facility]  . Shortness of Breath    x 2 days, [facility] (07/25/16) was told she had a virus   HPI:    Patient is a 56 y.o. Caucasian female who presents for f/u recent illness and elevated bp. Six days ago she had some diarrhea, low grade fever (Tm 100.4), cough, had some mild PND but nose was no diff than her usual congestion.  Generalized weakness but no body aches.   [facility] and was told she had a viral infection, and her bp and HR were elevated to pt was concerned. Currently still has diffuse/generalized weakness.  Nasal stuffiness continues.  Cough remains at night, dry. No more fever or diarrhea.  No n/v.  Appetite good, trying to push fluids lately.  No dysuria, no urgency or frequency. No rash.  Sometimes has a HA.  Tylenol takes care of this.  Mild pain in face this morning.  Blows nose and slight bloody mucous comes out.  Uses saline nasal spray and zyrtec chronically. [facility] 148/96 and P 128,second time was 132/90, P 140. BP at home 120s/80s, HR 90s.  Has 2 cups coffee per day.  No nicotine.  No SOB, no wheezing, no chest pain. No hx of recent surgical procedure, prolonged immobility, and no hx of hypercoagulable state.  Past Medical History:  Diagnosis Date  . Amenorrhea   . Blood transfusion without reported diagnosis    Birth  . Chronic renal insufficiency, stage II (mild)    GFR 60s  . History of abnormal Pap smear    AGUS; ASCUS, with high risk HPV+, (colpo neg 09/2012--repeat pap 12/16/15 nl, no HR HPV--per GYN needs pap and HR HPV testing again in 3 yrs))  . Hypertension   . Osteoarthritis    fingers  . Right foot pain    neuroma (Dr. Paulla Dolly)  . Seasonal allergic rhinitis 09/10/2012    Past Surgical History:  Procedure Laterality Date  . COLONOSCOPY W/  POLYPECTOMY  08/2013   Recall 5 yrs  . COLPOSCOPY    . Unremarkable      Outpatient Medications Prior to Visit  Medication Sig Dispense Refill  . cetirizine (ZYRTEC) 10 MG tablet Take 10 mg by mouth daily.    . diclofenac (VOLTAREN) 75 MG EC tablet Take 1 tablet (75 mg total) by mouth 2 (two) times daily. 50 tablet 2  . hydrochlorothiazide (HYDRODIURIL) 25 MG tablet Take 1 tablet (25 mg total) by mouth daily. 90 tablet 3  . metoprolol succinate (TOPROL-XL) 25 MG 24 hr tablet TAKE 1 TABLET (25 MG TOTAL) BY MOUTH DAILY. 30 tablet 6  . diclofenac (VOLTAREN) 75 MG EC tablet Take 1 tablet (75 mg total) by mouth 2 (two) times daily. (Patient not taking: Reported on 07/27/2016) 20 tablet 0   No facility-administered medications prior to visit.     No Known Allergies  ROS As per HPI  PE: Blood pressure 109/83, pulse 95, temperature 98.5 F (36.9 C), temperature source Oral, resp. rate 16, height 4' 11"  (1.499 m), weight 165 lb 4 oz (75 kg), last menstrual period 09/07/2012, SpO2 95 %. Gen: Alert, well appearing.  Patient is oriented to person, place, time, and situation. ENT: Ears: EACs clear, normal epithelium.  TMs  with good light reflex and landmarks bilaterally.  Eyes: no injection, icteris, swelling, or exudate.  EOMI, PERRLA. Nose: no drainage or turbinate edema/swelling.  No injection or focal lesion.  Mouth: lips without lesion/swelling.  Oral mucosa pink and moist.  Dentition intact and without obvious caries or gingival swelling.  Oropharynx without erythema, exudate, or swelling.  Neck - No masses or thyromegaly or limitation in range of motion CV: RRR, no m/r/g.   LUNGS: CTA bilat, nonlabored resps, good aeration in all lung fields. Abd: soft, NT/ND EXT: no clubbing, cyanosis, or edema.  No calf tenderness.  LABS:  Lab Results  Component Value Date   WBC 6.1 08/11/2013   HGB 14.0 08/11/2013   HCT 41.3 08/11/2013   MCV 90.1 08/11/2013   PLT 314.0 08/11/2013     Chemistry       Component Value Date/Time   NA 141 02/09/2016 1546   K 4.0 02/09/2016 1546   CL 100 02/09/2016 1546   CO2 31 02/09/2016 1546   BUN 19 02/09/2016 1546   CREATININE 0.91 02/09/2016 1546      Component Value Date/Time   CALCIUM 9.7 02/09/2016 1546   ALKPHOS 97 08/11/2013 1504   AST 38 (H) 08/11/2013 1504   ALT 85 (H) 08/11/2013 1504   BILITOT 0.5 08/11/2013 1504     IMPRESSION AND PLAN:  1) Viral syndrome (1 week duration of illness so far). Continue symptomatic care (add nasocort). Push fluids, rest. Her fatigue is likely a lingering symptom of her virus, but will go ahead and check CBC, CMET, and TSH today.   2) HTN, with recent [facility].  She said she had not taken her bp med that day + had low grade fever. Back to normal now.  HR much improved as well.  Reassured pt.  An After Visit Summary was printed and given to the patient.  FOLLOW UP: Return if symptoms worsen or fail to improve.  Signed:  Crissie Sickles, MD           07/27/2016

## 2016-07-28 ENCOUNTER — Other Ambulatory Visit (INDEPENDENT_AMBULATORY_CARE_PROVIDER_SITE_OTHER): Payer: BLUE CROSS/BLUE SHIELD

## 2016-07-28 DIAGNOSIS — R946 Abnormal results of thyroid function studies: Secondary | ICD-10-CM

## 2016-07-28 LAB — T4, FREE: Free T4: 0.81 ng/dL (ref 0.60–1.60)

## 2016-07-29 LAB — T3: T3 TOTAL: 140 ng/dL (ref 76–181)

## 2016-07-31 ENCOUNTER — Telehealth: Payer: Self-pay | Admitting: Family Medicine

## 2016-07-31 NOTE — Telephone Encounter (Signed)
Patient is returning call. She says it's best to call after 2:30pm today due to work.  Thank you,  -LL

## 2016-08-01 ENCOUNTER — Telehealth: Payer: Self-pay | Admitting: Family Medicine

## 2016-08-01 ENCOUNTER — Other Ambulatory Visit: Payer: Self-pay | Admitting: Family Medicine

## 2016-08-01 DIAGNOSIS — R748 Abnormal levels of other serum enzymes: Secondary | ICD-10-CM

## 2016-08-01 NOTE — Telephone Encounter (Signed)
Calling back about lab results. Best to call after 2:30pm.  Thank you,  -LL

## 2016-08-01 NOTE — Telephone Encounter (Signed)
Patient advised of labs.  See lab results.

## 2016-08-01 NOTE — Telephone Encounter (Signed)
Tried to contact patient.  LMOM for pt to CB.

## 2016-08-09 ENCOUNTER — Other Ambulatory Visit (INDEPENDENT_AMBULATORY_CARE_PROVIDER_SITE_OTHER): Payer: BLUE CROSS/BLUE SHIELD

## 2016-08-09 DIAGNOSIS — R748 Abnormal levels of other serum enzymes: Secondary | ICD-10-CM | POA: Diagnosis not present

## 2016-08-09 LAB — COMPREHENSIVE METABOLIC PANEL
ALT: 69 U/L — ABNORMAL HIGH (ref 0–35)
AST: 44 U/L — AB (ref 0–37)
Albumin: 4.2 g/dL (ref 3.5–5.2)
Alkaline Phosphatase: 92 U/L (ref 39–117)
BUN: 19 mg/dL (ref 6–23)
CHLORIDE: 101 meq/L (ref 96–112)
CO2: 30 meq/L (ref 19–32)
CREATININE: 1.1 mg/dL (ref 0.40–1.20)
Calcium: 9.2 mg/dL (ref 8.4–10.5)
GFR: 54.5 mL/min — ABNORMAL LOW (ref >60.00)
GLUCOSE: 115 mg/dL — AB (ref 70–99)
Potassium: 3.5 mEq/L (ref 3.5–5.1)
SODIUM: 139 meq/L (ref 135–145)
Total Bilirubin: 0.4 mg/dL (ref 0.2–1.2)
Total Protein: 7 g/dL (ref 6.0–8.3)

## 2016-08-10 ENCOUNTER — Telehealth: Payer: Self-pay | Admitting: Family Medicine

## 2016-08-10 ENCOUNTER — Encounter: Payer: Self-pay | Admitting: Family Medicine

## 2016-08-10 NOTE — Telephone Encounter (Signed)
Pt contacted.  See result note.

## 2016-08-10 NOTE — Telephone Encounter (Signed)
Missed call from [place] lab results. Please call after 2pm today.  Thank you,  -LL

## 2016-08-14 ENCOUNTER — Encounter: Payer: Self-pay | Admitting: *Deleted

## 2016-08-14 ENCOUNTER — Other Ambulatory Visit (INDEPENDENT_AMBULATORY_CARE_PROVIDER_SITE_OTHER): Payer: BLUE CROSS/BLUE SHIELD

## 2016-08-14 ENCOUNTER — Encounter: Payer: Self-pay | Admitting: Family Medicine

## 2016-08-14 DIAGNOSIS — R7301 Impaired fasting glucose: Secondary | ICD-10-CM | POA: Diagnosis not present

## 2016-08-14 LAB — POCT GLYCOSYLATED HEMOGLOBIN (HGB A1C): HEMOGLOBIN A1C: 5.5

## 2016-11-11 ENCOUNTER — Other Ambulatory Visit: Payer: Self-pay | Admitting: Family Medicine

## 2017-03-15 ENCOUNTER — Other Ambulatory Visit: Payer: Self-pay | Admitting: Family Medicine

## 2017-08-19 ENCOUNTER — Other Ambulatory Visit: Payer: Self-pay | Admitting: Family Medicine

## 2018-09-23 ENCOUNTER — Encounter: Payer: Self-pay | Admitting: Internal Medicine

## 2018-11-07 ENCOUNTER — Ambulatory Visit (INDEPENDENT_AMBULATORY_CARE_PROVIDER_SITE_OTHER): Payer: BLUE CROSS/BLUE SHIELD | Admitting: Family Medicine

## 2018-11-07 ENCOUNTER — Encounter: Payer: Self-pay | Admitting: Family Medicine

## 2018-11-07 ENCOUNTER — Other Ambulatory Visit: Payer: Self-pay

## 2018-11-07 VITALS — BP 138/86 | HR 99 | Temp 98.1°F | Resp 16 | Ht 59.0 in | Wt 171.0 lb

## 2018-11-07 DIAGNOSIS — Z01818 Encounter for other preprocedural examination: Secondary | ICD-10-CM | POA: Diagnosis not present

## 2018-11-07 DIAGNOSIS — G5762 Lesion of plantar nerve, left lower limb: Secondary | ICD-10-CM | POA: Diagnosis not present

## 2018-11-07 DIAGNOSIS — I10 Essential (primary) hypertension: Secondary | ICD-10-CM | POA: Diagnosis not present

## 2018-11-07 DIAGNOSIS — R7301 Impaired fasting glucose: Secondary | ICD-10-CM | POA: Diagnosis not present

## 2018-11-07 DIAGNOSIS — Z0000 Encounter for general adult medical examination without abnormal findings: Secondary | ICD-10-CM

## 2018-11-07 MED ORDER — HYDROCHLOROTHIAZIDE 25 MG PO TABS: 25.0000 mg | ORAL_TABLET | Freq: Every day | ORAL | 3 refills | Status: DC

## 2018-11-07 NOTE — Progress Notes (Signed)
Office Note 11/07/2018  CC:  Chief Complaint  Patient presents with  . surgical clearance    HPI:  Belinda Cox is a 58 y.o. White female who is here for surgical clearance. She is going to have left foot morton's neuroma excision by a podiatrist in [place], [place].  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 No unusual SOB.  No CP.  No wheezing or cough.  She does not smoke. She can have intercourse w/out problems with breathing or CP. No snoring.  No witnessed apneic spells.  HTN: home monitoring 120s/70s.  This is while on hctz only.  No pain anywhere currently.  She went through a series of steroid shots into the foot and it helped but she has still elected for surgery.   Past Medical History:  Diagnosis Date  . Amenorrhea   . Blood transfusion without reported diagnosis    Birth  . Chronic renal insufficiency, stage II (mild)    GFR 60s  . Elevated transaminase level    Viral hep serologies neg.  Suspect NASH.  Marland Kitchen History of abnormal Pap smear    AGUS; ASCUS, with high risk HPV+, (colpo neg 09/2012--repeat pap 12/16/15 nl, no HR HPV--per GYN needs pap and HR HPV testing again in 3 yrs))  . Hypertension   . IFG (impaired fasting glucose) 07/2016   HbA1c 5.5%  . Osteoarthritis    fingers  . Right foot pain    neuroma (Dr. Paulla Dolly)  . Seasonal allergic rhinitis 09/10/2012    Past Surgical History:  Procedure Laterality Date  . COLONOSCOPY W/ POLYPECTOMY  08/2013   Recall 5 yrs  . COLPOSCOPY    . Unremarkable      Family History  Problem Relation Age of Onset  . Alcoholism Maternal Grandfather   . Alcoholism Brother   . Arthritis Father        Alive  . Hypertension Father   . Heart disease Maternal Grandmother   . Diabetes Maternal Grandmother   . Cancer - Other Maternal Grandmother        Intestinal  . Pancreatic cancer Maternal Grandmother   . Hypertension Mother        Alive  . Diabetes Mother   .  Cancer Mother   . Uterine cancer Mother   . Diabetes Paternal Grandmother   . Leukemia Sister   . Colon cancer Neg Hx   . Rectal cancer Neg Hx   . Stomach cancer Neg Hx     Social History   Socioeconomic History  . Marital status: Married    Spouse name: Not on file  . Number of children: Not on file  . Years of education: Not on file  . Highest education level: Not on file  Occupational History  . Not on file  Social Needs  . Financial resource strain: Not on file  . Food insecurity    Worry: Not on file    Inability: Not on file  . Transportation needs    Medical: Not on file    Non-medical: Not on file  Tobacco Use  . Smoking status: Never Smoker  . Smokeless tobacco: Never Used  Substance and Sexual Activity  . Alcohol use: No  . Drug use: No  . Sexual activity: Yes    Partners: Male    Birth control/protection: None, Post-menopausal  Lifestyle  . Physical activity    Days per week: Not on file  Minutes per session: Not on file  . Stress: Not on file  Relationships  . Social Herbalist on phone: Not on file    Gets together: Not on file    Attends religious service: Not on file    Active member of club or organization: Not on file    Attends meetings of clubs or organizations: Not on file    Relationship status: Not on file  . Intimate partner violence    Fear of current or ex partner: Not on file    Emotionally abused: Not on file    Physically abused: Not on file    Forced sexual activity: Not on file  Other Topics Concern  . Not on file  Social History Narrative   Married, has 2 grown children.   Orig from [place], [place].   Lives in [place], [place].   Occupation: early childhood ed Pharmacist, hospital.   No Tob/alc/drugs.    Outpatient Medications Prior to Visit  Medication Sig Dispense Refill  . cetirizine (ZYRTEC) 10 MG tablet Take 10 mg by mouth daily.    . hydrochlorothiazide (HYDRODIURIL) 25 MG tablet TAKE 1 TABLET (25 MG TOTAL) BY MOUTH  DAILY. 90 tablet 1  . diclofenac (VOLTAREN) 75 MG EC tablet Take 1 tablet (75 mg total) by mouth 2 (two) times daily. (Patient not taking: Reported on 11/07/2018) 50 tablet 2  . metoprolol succinate (TOPROL-XL) 25 MG 24 hr tablet TAKE 1 TABLET (25 MG TOTAL) BY MOUTH DAILY. (Patient not taking: Reported on 11/07/2018) 30 tablet 0   No facility-administered medications prior to visit.     No Known Allergies  ROS Review of Systems  Constitutional: Negative for appetite change, chills, fatigue and fever.  HENT: Negative for congestion, dental problem, ear pain and sore throat.   Eyes: Negative for discharge, redness and visual disturbance.  Respiratory: Negative for cough, chest tightness, shortness of breath and wheezing.   Cardiovascular: Negative for chest pain, palpitations and leg swelling.  Gastrointestinal: Negative for abdominal pain, blood in stool, diarrhea, nausea and vomiting.  Genitourinary: Negative for difficulty urinating, dysuria, flank pain, frequency, hematuria and urgency.  Musculoskeletal: Negative for arthralgias, back pain, joint swelling, myalgias and neck stiffness.       Intermittent pain in webspace between 3rd and 4th toes of L foot.  Skin: Negative for pallor and rash.  Neurological: Negative for dizziness, speech difficulty, weakness and headaches.  Hematological: Negative for adenopathy. Does not bruise/bleed easily.  Psychiatric/Behavioral: Negative for confusion and sleep disturbance. The patient is not nervous/anxious.     PE; Blood pressure 138/86, pulse 99, temperature 98.1 F (36.7 C), temperature source Temporal, resp. rate 16, height 4' 11"  (1.499 m), weight 171 lb (77.6 kg), last menstrual period 09/07/2012, SpO2 95 %. Exam chaperoned by Caroll Rancher, LPN. Gen: Alert, well appearing.  Patient is oriented to person, place, time, and situation. AFFECT: pleasant, lucid thought and speech. ENT: Ears: EACs clear, normal epithelium.  TMs with good light  reflex and landmarks bilaterally.  Eyes: no injection, icteris, swelling, or exudate.  EOMI, PERRLA. Nose: no drainage or turbinate edema/swelling.  No injection or focal lesion.  Mouth: lips without lesion/swelling.  Oral mucosa pink and moist.  Dentition intact and without obvious caries or gingival swelling.  Oropharynx without erythema, exudate, or swelling.  Neck: supple/nontender.  No LAD, mass, or TM.  Carotid pulses 2+ bilaterally, without bruits. CV: RRR, no m/r/g.   LUNGS: CTA bilat, nonlabored resps, good aeration in  all lung fields. ABD: soft, NT, ND, BS normal.  No hepatospenomegaly or mass.  No bruits. EXT: no clubbing, cyanosis, or edema.  Musculoskeletal: no joint swelling, erythema, warmth, or tenderness.  ROM of all joints intact. Skin - no sores or suspicious lesions or rashes or color changes   Pertinent labs:  Lab Results  Component Value Date   TSH 4.55 (H) 07/27/2016   Lab Results  Component Value Date   WBC 7.6 07/27/2016   HGB 13.7 07/27/2016   HCT 39.6 07/27/2016   MCV 90.8 07/27/2016   PLT 392.0 07/27/2016   Lab Results  Component Value Date   CREATININE 1.10 08/09/2016   BUN 19 08/09/2016   NA 139 08/09/2016   K 3.5 08/09/2016   CL 101 08/09/2016   CO2 30 08/09/2016   Lab Results  Component Value Date   ALT 69 (H) 08/09/2016   AST 44 (H) 08/09/2016   ALKPHOS 92 08/09/2016   BILITOT 0.4 08/09/2016   Lab Results  Component Value Date   CHOL 176 08/11/2013   Lab Results  Component Value Date   HDL 52.70 08/11/2013   Lab Results  Component Value Date   LDLCALC 84 08/11/2013   Lab Results  Component Value Date   TRIG 197.0 (H) 08/11/2013   Lab Results  Component Value Date   CHOLHDL 3 08/11/2013   Lab Results  Component Value Date   HGBA1C 5.5 08/14/2016    ASSESSMENT AND PLAN:   Presurgical clearance. Well controlled HTN.  Continue hctz 65m qd. Low surgical risk.   Cleared for foot surgery, form completed. She'll return  for fasting health panel labs. Vaccines are UTD. She knows she needs to schedule a repeat colonoscopy and an annual mammogram and GYN visit for PAP.  An After Visit Summary was printed and given to the patient.  FOLLOW UP:  Return in about 1 year (around 11/07/2019) for annual CPE (fasting).  Signed:  PCrissie Sickles MD           11/07/2018

## 2018-11-26 ENCOUNTER — Ambulatory Visit: Payer: BLUE CROSS/BLUE SHIELD | Admitting: Family Medicine

## 2019-01-23 ENCOUNTER — Other Ambulatory Visit: Payer: Self-pay | Admitting: Family Medicine

## 2019-01-23 ENCOUNTER — Encounter: Payer: Self-pay | Admitting: Internal Medicine

## 2019-01-23 DIAGNOSIS — Z1231 Encounter for screening mammogram for malignant neoplasm of breast: Secondary | ICD-10-CM

## 2019-02-04 ENCOUNTER — Other Ambulatory Visit: Payer: Self-pay

## 2019-02-04 ENCOUNTER — Encounter: Payer: Self-pay | Admitting: Podiatry

## 2019-02-04 ENCOUNTER — Ambulatory Visit (INDEPENDENT_AMBULATORY_CARE_PROVIDER_SITE_OTHER): Payer: BLUE CROSS/BLUE SHIELD | Admitting: Podiatry

## 2019-02-04 ENCOUNTER — Ambulatory Visit (INDEPENDENT_AMBULATORY_CARE_PROVIDER_SITE_OTHER): Payer: BLUE CROSS/BLUE SHIELD

## 2019-02-04 ENCOUNTER — Ambulatory Visit: Payer: BLUE CROSS/BLUE SHIELD

## 2019-02-04 ENCOUNTER — Other Ambulatory Visit: Payer: Self-pay | Admitting: Podiatry

## 2019-02-04 DIAGNOSIS — M7672 Peroneal tendinitis, left leg: Secondary | ICD-10-CM | POA: Diagnosis not present

## 2019-02-04 DIAGNOSIS — M79671 Pain in right foot: Secondary | ICD-10-CM

## 2019-02-04 DIAGNOSIS — D3610 Benign neoplasm of peripheral nerves and autonomic nervous system, unspecified: Secondary | ICD-10-CM | POA: Diagnosis not present

## 2019-02-04 NOTE — Patient Instructions (Signed)
Pre-Operative Instructions  Congratulations, you have decided to take an important step towards improving your quality of life.  You can be assured that the doctors and staff at [facility] will be with you every step of the way.  Here are some important things you should know:  1. Plan to be at the surgery center/hospital at least 1 (one) hour prior to your scheduled time, unless otherwise directed by the surgical center/hospital staff.  You must have a responsible adult accompany you, remain during the surgery and drive you home.  Make sure you have directions to the surgical center/hospital to ensure you arrive on time. 2. If you are having surgery at [facility] or [facility], you will need a copy of your medical history and physical form from your family physician within one month prior to the date of surgery. We will give you a form for your primary physician to complete.  3. We make every effort to accommodate the date you request for surgery.  However, there are times where surgery dates or times have to be moved.  We will contact you as soon as possible if a change in schedule is required.   4. No aspirin/ibuprofen for one week before surgery.  If you are on aspirin, any non-steroidal anti-inflammatory medications (Mobic, Aleve, Ibuprofen) should not be taken seven (7) days prior to your surgery.  You make take Tylenol for pain prior to surgery.  5. Medications - If you are taking daily heart and blood pressure medications, seizure, reflux, allergy, asthma, anxiety, pain or diabetes medications, make sure you notify the surgery center/hospital before the day of surgery so they can tell you which medications you should take or avoid the day of surgery. 6. No food or drink after midnight the night before surgery unless directed otherwise by surgical center/hospital staff. 7. No alcoholic beverages 24-hours prior to surgery.  No smoking 24-hours prior or 24-hours after  surgery. 8. Wear loose pants or shorts. They should be loose enough to fit over bandages, boots, and casts. 9. Don't wear slip-on shoes. Sneakers are preferred. 10. Bring your boot with you to the surgery center/hospital.  Also bring crutches or a walker if your physician has prescribed it for you.  If you do not have this equipment, it will be provided for you after surgery. 11. If you have not been contacted by the surgery center/hospital by the day before your surgery, call to confirm the date and time of your surgery. 12. Leave-time from work may vary depending on the type of surgery you have.  Appropriate arrangements should be made prior to surgery with your employer. 13. Prescriptions will be provided immediately following surgery by your doctor.  Fill these as soon as possible after surgery and take the medication as directed. Pain medications will not be refilled on weekends and must be approved by the doctor. 14. Remove nail polish on the operative foot and avoid getting pedicures prior to surgery. 15. Wash the night before surgery.  The night before surgery wash the foot and leg well with water and the antibacterial soap provided. Be sure to pay special attention to beneath the toenails and in between the toes.  Wash for at least three (3) minutes. Rinse thoroughly with water and dry well with a towel.  Perform this wash unless told not to do so by your physician.  Enclosed: 1 Ice pack (please put in freezer the night before surgery)   1 Hibiclens skin cleaner     Pre-op instructions  If you have any questions regarding the instructions, please do not hesitate to call our office.  [place]: [address], [place], [place] [address] -- [phone]  [place]: [address] [place], [place] [address] -- [phone]  [place]: [address]  [place], [place] [address] -- [phone]   Website: [email or web address] 

## 2019-02-07 ENCOUNTER — Encounter: Payer: Self-pay | Admitting: Internal Medicine

## 2019-02-07 ENCOUNTER — Other Ambulatory Visit: Payer: Self-pay

## 2019-02-07 ENCOUNTER — Ambulatory Visit (AMBULATORY_SURGERY_CENTER): Payer: Self-pay | Admitting: *Deleted

## 2019-02-07 VITALS — Temp 97.6°F | Ht 59.0 in | Wt 165.6 lb

## 2019-02-07 DIAGNOSIS — Z86010 Personal history of colonic polyps: Secondary | ICD-10-CM

## 2019-02-07 MED ORDER — NA SULFATE-K SULFATE-MG SULF 17.5-3.13-1.6 GM/177ML PO SOLN: 1.0000 | Freq: Once | ORAL | 0 refills | Status: AC

## 2019-02-07 NOTE — Progress Notes (Signed)
No egg or soy allergy known to patient  No issues with past sedation with any surgeries  or procedures, no intubation problems  No diet pills per patient No home 02 use per patient  No blood thinners per patient  Pt denies issues with constipation  No A fib or A flutter  EMMI video sent to pt's e mail   Due to the COVID-19 pandemic we are asking patients to follow these guidelines. Please only bring one care partner. Please be aware that your care partner may wait in the car in the parking lot or if they feel like they will be too hot to wait in the car, they may wait in the lobby on the 4th floor. All care partners are required to wear a mask the entire time (we do not have any that we can provide them), they need to practice social distancing, and we will do a Covid check for all patient's and care partners when you arrive. Also we will check their temperature and your temperature. If the care partner waits in their car they need to stay in the parking lot the entire time and we will call them on their cell phone when the patient is ready for discharge so they can bring the car to the front of the building. Also all patient's will need to wear a mask into building.

## 2019-02-10 NOTE — Progress Notes (Signed)
Subjective:   Patient ID: Belinda Cox, female   DOB: 58 y.o.   MRN: [id number]   HPI Patient presents stating still having some discomfort in the left foot and states that she is probably can end up needing surgery on this.  States that it is been very tender and making it hard to walk and that she has some shooting pain between the third and fourth toe which simply does not get better even with wider shoes and previous injections   ROS      Objective:  Physical Exam  Neurovascular status was found to be intact with positive Biagio Borg sign with shooting pains third interspace left and radiating discomfort into the adjacent digits with patient noted to have discomfort in the outside of the left foot which seems to be completely separate problem that she is been walking differently because of the neuroma symptomatology     Assessment:  Neuroma symptoms left with peroneal tendinitis left     Plan:  H&P reviewed both conditions and for the neuroma I do think that surgical intervention is indicated due to long-term history and patient wants surgery and I allowed her to read consent form going over surgery all possible complications and risk factors.  For the outside of the foot I did do sterile prep injected the tendon group 3 mg Kenalog 5 mg Xylocaine and I then went ahead and I explained the total recovery can take approximately 6 months and she may get some numbness long-term between her toes  X-rays negative for fracture appears to be soft tissue in origin

## 2019-02-12 ENCOUNTER — Telehealth: Payer: Self-pay | Admitting: Podiatry

## 2019-02-12 NOTE — Telephone Encounter (Signed)
DOS: 02/25/2019 SURGICAL PROCEDURE: NEURECTOMY INNERSPACE 3RD LEFT CPT CODE: 79150 DX CODE: G57.60  Reference Number: [id number]  Per Jobe Marker with [employer] of [place],  Pt is responsible for $150 co-pay with benefits covered at 100%.  Will be applied to patients out of pocket which is $4,000 and only $470 has been met.   No Pre cert is required.

## 2019-02-13 DIAGNOSIS — E782 Mixed hyperlipidemia: Secondary | ICD-10-CM

## 2019-02-13 HISTORY — DX: Mixed hyperlipidemia: E78.2

## 2019-02-19 ENCOUNTER — Other Ambulatory Visit: Payer: Self-pay

## 2019-02-20 ENCOUNTER — Encounter: Payer: Self-pay | Admitting: Obstetrics and Gynecology

## 2019-02-20 ENCOUNTER — Ambulatory Visit: Payer: BLUE CROSS/BLUE SHIELD | Admitting: Obstetrics and Gynecology

## 2019-02-20 ENCOUNTER — Telehealth: Payer: Self-pay

## 2019-02-20 ENCOUNTER — Other Ambulatory Visit (HOSPITAL_COMMUNITY)
Admission: RE | Admit: 2019-02-20 | Discharge: 2019-02-20 | Disposition: A | Discharge status: Home / Self Care | Payer: BLUE CROSS/BLUE SHIELD | Source: Ambulatory Visit | Attending: Obstetrics and Gynecology | Admitting: Obstetrics and Gynecology

## 2019-02-20 VITALS — BP 150/98 | HR 88 | Temp 97.2°F | Resp 14 | Ht 59.0 in | Wt 164.0 lb

## 2019-02-20 DIAGNOSIS — Z124 Encounter for screening for malignant neoplasm of cervix: Secondary | ICD-10-CM | POA: Insufficient documentation

## 2019-02-20 DIAGNOSIS — I10 Essential (primary) hypertension: Secondary | ICD-10-CM | POA: Diagnosis not present

## 2019-02-20 DIAGNOSIS — Z01419 Encounter for gynecological examination (general) (routine) without abnormal findings: Secondary | ICD-10-CM

## 2019-02-20 NOTE — Telephone Encounter (Signed)
Covid-19 screening questions   Do you now or have you had a fever in the last 14 days? NO   Do you have any respiratory symptoms of shortness of breath or cough now or in the last 14 days? NO   Do you have any family members or close contacts with diagnosed or suspected Covid-19 in the past 14 days? NO   Have you been tested for Covid-19 and found to be positive? NO

## 2019-02-20 NOTE — Patient Instructions (Signed)

## 2019-02-20 NOTE — Progress Notes (Signed)
58 y.o. G34P2002 Married White or Caucasian Not Hispanic or Latino female here for annual exam.  No vaginal bleeding. No dyspareunia.     Patient's last menstrual period was 09/07/2012.          Sexually active: Yes.    The current method of family planning is post menopausal status.    Exercising: No.  The patient does not participate in regular exercise at present. Smoker:  no  Health Maintenance: Pap:  12/16/15 WNL History of abnormal Pap:  Yes, hx of Colposcopy MMG:  12/09/15 BIRADS 1 negative/density b -- scheduled 03/12/19 BMD:   never Colonoscopy: 08/25/13 Polypectomy -- patient is scheduled tomorrow 02/21/19 TDaP:  2015 Gardasil: n/a   reports that she has never smoked. She has never used smokeless tobacco. She reports that she does not drink alcohol or use drugs. 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 She has a 44 month old grand daughter, will stay home and take care of her. Other daughter is due in January (girl), she will take care of her as well.   Past Medical History:  Diagnosis Date  . Allergy    seasonal  . Amenorrhea   . Blood transfusion without reported diagnosis    Birth  . Chronic renal insufficiency, stage II (mild)    GFR 60s  . Elevated transaminase level    Viral hep serologies neg.  Suspect NASH.  Marland Kitchen History of abnormal Pap smear    AGUS; ASCUS, with high risk HPV+, (colpo neg 09/2012--repeat pap 12/16/15 nl, no HR HPV--per GYN needs pap and HR HPV testing again in 3 yrs))  . Hypertension   . IFG (impaired fasting glucose) 07/2016   HbA1c 5.5%  . Osteoarthritis    fingers  . Right foot pain    neuroma (Dr. Paulla Dolly)  . Seasonal allergic rhinitis 09/10/2012    Past Surgical History:  Procedure Laterality Date  . COLONOSCOPY    . COLONOSCOPY W/ POLYPECTOMY  08/2013   Recall 5 yrs  . COLPOSCOPY    . POLYPECTOMY    . Unremarkable      Current Outpatient Medications  Medication Sig Dispense Refill  . cetirizine (ZYRTEC) 10 MG tablet Take 10 mg by mouth  daily.    . hydrochlorothiazide (HYDRODIURIL) 25 MG tablet Take 1 tablet (25 mg total) by mouth daily. 90 tablet 3   No current facility-administered medications for this visit.     Family History  Problem Relation Age of Onset  . Alcoholism Maternal Grandfather   . Alcoholism Brother   . Arthritis Father        Alive  . Hypertension Father   . Heart disease Maternal Grandmother   . Diabetes Maternal Grandmother   . Cancer - Other Maternal Grandmother        Intestinal  . Pancreatic cancer Maternal Grandmother   . Hypertension Mother        Alive  . Diabetes Mother   . Cancer Mother   . Uterine cancer Mother   . Diabetes Paternal Grandmother   . Leukemia Sister   . Colon cancer Neg Hx   . Rectal cancer Neg Hx   . Stomach cancer Neg Hx   . Colon polyps Neg Hx   . Esophageal cancer Neg Hx     Review of Systems  Constitutional: Negative.   HENT: Negative.   Eyes: Negative.   Respiratory: Negative.   Cardiovascular: Negative.   Gastrointestinal: Negative.   Endocrine: Negative.   Genitourinary: Negative.  Musculoskeletal: Negative.   Skin: Negative.   Allergic/Immunologic: Negative.   Neurological: Negative.   Hematological: Negative.   Psychiatric/Behavioral: Negative.     Exam:   BP (!) 150/98 (BP Location: Right Arm, Cuff Size: Normal)   Pulse 88   Temp (!) 97.2 F (36.2 C) (Temporal)   Resp 14   Ht 4' 11"  (1.499 m)   Wt 164 lb (74.4 kg)   LMP 09/07/2012   BMI 33.12 kg/m   Weight change: @WEIGHTCHANGE @ Height:   Height: 4' 11"  (149.9 cm)  Ht Readings from Last 3 Encounters:  02/20/19 4' 11"  (1.499 m)  02/07/19 4' 11"  (1.499 m)  11/07/18 4' 11"  (1.499 m)    General appearance: alert, cooperative and appears stated age Head: Normocephalic, without obvious abnormality, atraumatic Neck: no adenopathy, supple, symmetrical, trachea midline and thyroid normal to inspection and palpation Lungs: clear to auscultation bilaterally Cardiovascular: regular  rate and rhythm Breasts: normal appearance, no masses or tenderness Abdomen: soft, non-tender; non distended,  no masses,  no organomegaly Extremities: extremities normal, atraumatic, no cyanosis or edema Skin: Skin color, texture, turgor normal. No rashes or lesions Lymph nodes: Cervical, supraclavicular, and axillary nodes normal. No abnormal inguinal nodes palpated Neurologic: Grossly normal   Pelvic: External genitalia:  no lesions              Urethra:  normal appearing urethra with no masses, tenderness or lesions              Bartholins and Skenes: normal                 Vagina: atrophic appearing vagina with normal color and discharge, no lesions              Cervix: no lesions               Bimanual Exam:  Uterus:  normal size, contour, position, consistency, mobility, non-tender              Adnexa: no mass, fullness, tenderness               Rectovaginal: Confirms               Anus:  normal sphincter tone, no lesions  Chaperone was present for exam.  A:  Well Woman with normal exam  HTN, managed by primary. High today, dog died today.   P:   Pap with hpv  Colonoscopy tomorrow  Mammogram is scheduled  Labs with primary  Discussed breast self exam  Discussed calcium and vit D intake  Repeat BP 150/98, she will f/u with her primary

## 2019-02-21 ENCOUNTER — Encounter: Payer: Self-pay | Admitting: Internal Medicine

## 2019-02-21 ENCOUNTER — Other Ambulatory Visit: Payer: Self-pay

## 2019-02-21 ENCOUNTER — Ambulatory Visit (AMBULATORY_SURGERY_CENTER): Payer: BLUE CROSS/BLUE SHIELD | Admitting: Internal Medicine

## 2019-02-21 VITALS — BP 109/72 | HR 76 | Temp 97.6°F | Resp 15 | Ht 59.0 in | Wt 165.5 lb

## 2019-02-21 DIAGNOSIS — K5289 Other specified noninfective gastroenteritis and colitis: Secondary | ICD-10-CM | POA: Diagnosis not present

## 2019-02-21 DIAGNOSIS — D123 Benign neoplasm of transverse colon: Secondary | ICD-10-CM

## 2019-02-21 DIAGNOSIS — Z86010 Personal history of colonic polyps: Secondary | ICD-10-CM

## 2019-02-21 DIAGNOSIS — K529 Noninfective gastroenteritis and colitis, unspecified: Secondary | ICD-10-CM

## 2019-02-21 MED ORDER — SODIUM CHLORIDE 0.9 % IV SOLN: 500.0000 mL | Freq: Once | INTRAVENOUS | Status: DC

## 2019-02-21 NOTE — Op Note (Signed)
[facility] Patient Name: Belinda Cox Procedure Date: 02/21/2019 9:24 AM MRN: [id number] Endoscopist: Docia Chuck. Henrene Pastor , MD Age: 58 Referring MD:  Date of Birth: May 22, 1960 Gender: Female Account #: [id number] Procedure:                Colonoscopy with cold snare polypectomy x 1; with                            biopsies Indications:              High risk colon cancer surveillance: Personal                            history of sessile serrated colon polyp (less than                            10 mm in size) with no dysplasia. Previous                            examination April 2015. Also noted to have mild                            focal colitis in the cecum Medicines:                Monitored Anesthesia Care Procedure:                Pre-Anesthesia Assessment:                           - Prior to the procedure, a History and Physical                            was performed, and patient medications and                            allergies were reviewed. The patient's tolerance of                            previous anesthesia was also reviewed. The risks                            and benefits of the procedure and the sedation                            options and risks were discussed with the patient.                            All questions were answered, and informed consent                            was obtained. Prior Anticoagulants: The patient has                            taken no previous anticoagulant or antiplatelet  agents. ASA Grade Assessment: II - A patient with                            mild systemic disease. After reviewing the risks                            and benefits, the patient was deemed in                            satisfactory condition to undergo the procedure.                           After obtaining informed consent, the colonoscope                            was passed under direct vision. Throughout the                             procedure, the patient's blood pressure, pulse, and                            oxygen saturations were monitored continuously. The                            Colonoscope was introduced through the anus and                            advanced to the the cecum, identified by                            appendiceal orifice and ileocecal valve. The                            terminal ileum, ileocecal valve, appendiceal                            orifice, and rectum were photographed. The quality                            of the bowel preparation was excellent. The                            colonoscopy was performed without difficulty. The                            patient tolerated the procedure well. The bowel                            preparation used was SUPREP via split dose                            instruction. Scope In: 9:45:02 AM Scope Out: 9:59:17 AM Scope Withdrawal Time: 0 hours 12 minutes 17 seconds  Total Procedure Duration: 0 hours 14 minutes 15  seconds  Findings:                 The terminal ileum appeared normal.                           Patchy mild inflammation characterized by erythema                            and granularity was found in the cecum. Biopsies                            were taken with a cold forceps for histology.                           A 2 mm polyp was found in the transverse colon. The                            polyp was removed with a cold snare. Resection and                            retrieval were complete.                           A single small-mouthed diverticulum was found in                            the transverse colon.                           The exam was otherwise without abnormality on                            direct and retroflexion views. Complications:            No immediate complications. Estimated blood loss:                            None. Estimated Blood Loss:     Estimated blood loss:  none. Impression:               - The examined portion of the ileum was normal.                           - Patchy mild inflammation was found in the cecum                            secondary to colitis. Biopsied.                           - One 2 mm polyp in the transverse colon, removed                            with a cold snare. Resected and retrieved.                           -  Diverticulosis in the transverse colon.                           - The examination was otherwise normal on direct                            and retroflexion views. Recommendation:           - Repeat colonoscopy in 5 years for surveillance.                           - Patient has a contact number available for                            emergencies. The signs and symptoms of potential                            delayed complications were discussed with the                            patient. Return to normal activities tomorrow.                            Written discharge instructions were provided to the                            patient.                           - Resume previous diet.                           - Continue present medications.                           - Await pathology results. Docia Chuck. Henrene Pastor, MD 02/21/2019 10:09:52 AM This report has been signed electronically.

## 2019-02-21 NOTE — Patient Instructions (Signed)
Impression/Recommendations:  Diverticulosis handout given to patient. Polyp handout given to patient.  Repeat colonscopy in 5 years for surveillance.  Resume previous diet. Continue present medications. Await pathology results.  YOU HAD AN ENDOSCOPIC PROCEDURE TODAY AT [facility]:   Refer to the procedure report that was given to you for any specific questions about what was found during the examination.  If the procedure report does not answer your questions, please call your gastroenterologist to clarify.  If you requested that your care partner not be given the details of your procedure findings, then the procedure report has been included in a sealed envelope for you to review at your convenience later.  YOU SHOULD EXPECT: Some feelings of bloating in the abdomen. Passage of more gas than usual.  Walking can help get rid of the air that was put into your GI tract during the procedure and reduce the bloating. If you had a lower endoscopy (such as a colonoscopy or flexible sigmoidoscopy) you may notice spotting of blood in your stool or on the toilet paper. If you underwent a bowel prep for your procedure, you may not have a normal bowel movement for a few days.  Please Note:  You might notice some irritation and congestion in your nose or some drainage.  This is from the oxygen used during your procedure.  There is no need for concern and it should clear up in a day or so.  SYMPTOMS TO REPORT IMMEDIATELY:   Following lower endoscopy (colonoscopy or flexible sigmoidoscopy):  Excessive amounts of blood in the stool  Significant tenderness or worsening of abdominal pains  Swelling of the abdomen that is new, acute  Fever of 100F or higher For urgent or emergent issues, a gastroenterologist can be reached at any hour by calling [phone].   DIET:  We do recommend a small meal at first, but then you may proceed to your regular diet.  Drink plenty of fluids but you  should avoid alcoholic beverages for 24 hours.  ACTIVITY:  You should plan to take it easy for the rest of today and you should NOT DRIVE or use heavy machinery until tomorrow (because of the sedation medicines used during the test).    FOLLOW UP: Our staff will call the number listed on your records 48-72 hours following your procedure to check on you and address any questions or concerns that you may have regarding the information given to you following your procedure. If we do not reach you, we will leave a message.  We will attempt to reach you two times.  During this call, we will ask if you have developed any symptoms of COVID 19. If you develop any symptoms (ie: fever, flu-like symptoms, shortness of breath, cough etc.) before then, please call [phone].  If you test positive for Covid 19 in the 2 weeks post procedure, please call and report this information to [place].    If any biopsies were taken you will be contacted by phone or by letter within the next 1-3 weeks.  Please call us at [phone] if you have not heard about the biopsies in 3 weeks.    SIGNATURES/CONFIDENTIALITY: You and/or your care partner have signed paperwork which will be entered into your electronic medical record.  These signatures attest to the fact that that the information above on your After Visit Summary has been reviewed and is understood.  Full responsibility of the confidentiality of this discharge information lies with you and/or  your care-partner.

## 2019-02-21 NOTE — Progress Notes (Signed)
PT taken to PACU. Monitors in place. VSS. Report given to RN. 

## 2019-02-21 NOTE — Progress Notes (Signed)
Vitals-Belinda Cox Temp-Lisa  History reviewed.

## 2019-02-24 ENCOUNTER — Other Ambulatory Visit: Payer: Self-pay

## 2019-02-24 ENCOUNTER — Ambulatory Visit (INDEPENDENT_AMBULATORY_CARE_PROVIDER_SITE_OTHER): Payer: BLUE CROSS/BLUE SHIELD | Admitting: Family Medicine

## 2019-02-24 DIAGNOSIS — I10 Essential (primary) hypertension: Secondary | ICD-10-CM | POA: Diagnosis not present

## 2019-02-24 DIAGNOSIS — R7301 Impaired fasting glucose: Secondary | ICD-10-CM | POA: Diagnosis not present

## 2019-02-24 LAB — CBC WITH DIFFERENTIAL/PLATELET
Basophils Absolute: 0 10*3/uL (ref 0.0–0.1)
Basophils Relative: 0.8 % (ref 0.0–3.0)
Eosinophils Absolute: 0.1 10*3/uL (ref 0.0–0.7)
Eosinophils Relative: 1.5 % (ref 0.0–5.0)
HCT: 40.3 % (ref 36.0–46.0)
Hemoglobin: 13.6 g/dL (ref 12.0–15.0)
Lymphocytes Relative: 34.2 % (ref 12.0–46.0)
Lymphs Abs: 2 10*3/uL (ref 0.7–4.0)
MCHC: 33.9 g/dL (ref 30.0–36.0)
MCV: 93.9 fl (ref 78.0–100.0)
Monocytes Absolute: 0.3 10*3/uL (ref 0.1–1.0)
Monocytes Relative: 5 % (ref 3.0–12.0)
Neutro Abs: 3.4 10*3/uL (ref 1.4–7.7)
Neutrophils Relative %: 58.5 % (ref 43.0–77.0)
Platelets: 254 10*3/uL (ref 150.0–400.0)
RBC: 4.29 Mil/uL (ref 3.87–5.11)
RDW: 13.1 % (ref 11.5–15.5)
WBC: 5.8 10*3/uL (ref 4.0–10.5)

## 2019-02-24 LAB — HEMOGLOBIN A1C: Hgb A1c MFr Bld: 5.7 % (ref 4.6–6.5)

## 2019-02-25 ENCOUNTER — Telehealth: Payer: Self-pay

## 2019-02-25 ENCOUNTER — Encounter: Payer: Self-pay | Admitting: Podiatry

## 2019-02-25 ENCOUNTER — Telehealth: Payer: Self-pay | Admitting: *Deleted

## 2019-02-25 DIAGNOSIS — G5762 Lesion of plantar nerve, left lower limb: Secondary | ICD-10-CM

## 2019-02-25 LAB — COMPREHENSIVE METABOLIC PANEL
ALT: 85 U/L — ABNORMAL HIGH (ref 0–35)
AST: 41 U/L — ABNORMAL HIGH (ref 0–37)
Albumin: 4.3 g/dL (ref 3.5–5.2)
Alkaline Phosphatase: 91 U/L (ref 39–117)
BUN: 22 mg/dL (ref 6–23)
CO2: 29 mEq/L (ref 19–32)
Calcium: 9.5 mg/dL (ref 8.4–10.5)
Chloride: 103 mEq/L (ref 96–112)
Creatinine, Ser: 1.13 mg/dL (ref 0.40–1.20)
GFR: 49.27 mL/min — ABNORMAL LOW (ref >60.00)
Glucose, Bld: 93 mg/dL (ref 70–99)
Potassium: 3.8 mEq/L (ref 3.5–5.1)
Sodium: 141 mEq/L (ref 135–145)
Total Bilirubin: 0.4 mg/dL (ref 0.2–1.2)
Total Protein: 7 g/dL (ref 6.0–8.3)

## 2019-02-25 LAB — TSH: TSH: 1.41 u[IU]/mL (ref 0.35–4.50)

## 2019-02-25 NOTE — Telephone Encounter (Signed)
  Follow up Call-  Call back number 02/21/2019  Post procedure Call Back phone  # [phone]  Permission to leave phone message Yes  Some recent data might be hidden     1. Patient questions:Have you developed a fever since your procedure? no  2.   Have you had an respiratory symptoms (SOB or cough) since your procedure? no  3.   Have you tested positive for COVID 19 since your procedure no  4.   Have you had any family members/close contacts diagnosed with the COVID 19 since your procedure?  no   If yes to any of these questions please route to Joylene John, RN and Alphonsa Gin, Therapist, sports.   Do you have a fever, pain , or abdominal swelling? No. Pain Score  0 *  Have you tolerated food without any problems? Yes.    Have you been able to return to your normal activities? Yes.    Do you have any questions about your discharge instructions: Diet   No. Medications  No. Follow up visit  No.  Do you have questions or concerns about your Care? No.  Actions: * If pain score is 4 or above: No action needed, pain <4.

## 2019-02-25 NOTE — Telephone Encounter (Signed)
Follow up call made.  NALM

## 2019-02-26 ENCOUNTER — Encounter: Payer: Self-pay | Admitting: Internal Medicine

## 2019-02-28 ENCOUNTER — Encounter: Payer: Self-pay | Admitting: Podiatry

## 2019-03-02 ENCOUNTER — Encounter: Payer: Self-pay | Admitting: Family Medicine

## 2019-03-03 ENCOUNTER — Other Ambulatory Visit: Payer: Self-pay

## 2019-03-03 ENCOUNTER — Encounter: Payer: Self-pay | Admitting: Podiatry

## 2019-03-03 ENCOUNTER — Ambulatory Visit (INDEPENDENT_AMBULATORY_CARE_PROVIDER_SITE_OTHER): Payer: BLUE CROSS/BLUE SHIELD | Admitting: Podiatry

## 2019-03-03 DIAGNOSIS — D3610 Benign neoplasm of peripheral nerves and autonomic nervous system, unspecified: Secondary | ICD-10-CM | POA: Diagnosis not present

## 2019-03-03 DIAGNOSIS — Z09 Encounter for follow-up examination after completed treatment for conditions other than malignant neoplasm: Secondary | ICD-10-CM

## 2019-03-04 ENCOUNTER — Ambulatory Visit (INDEPENDENT_AMBULATORY_CARE_PROVIDER_SITE_OTHER): Payer: BLUE CROSS/BLUE SHIELD | Admitting: Family Medicine

## 2019-03-04 ENCOUNTER — Encounter: Payer: Self-pay | Admitting: Family Medicine

## 2019-03-04 DIAGNOSIS — I10 Essential (primary) hypertension: Secondary | ICD-10-CM

## 2019-03-04 LAB — LIPID PANEL
Cholesterol: 251 mg/dL — ABNORMAL HIGH (ref 0–200)
HDL: 57.5 mg/dL (ref >39.00)
NonHDL: 193.75
Total CHOL/HDL Ratio: 4
Triglycerides: 273 mg/dL — ABNORMAL HIGH (ref 0.0–149.0)
VLDL: 54.6 mg/dL — ABNORMAL HIGH (ref 0.0–40.0)

## 2019-03-04 LAB — CYTOLOGY - PAP
Comment: NEGATIVE
Diagnosis: NEGATIVE
High risk HPV: NEGATIVE

## 2019-03-04 LAB — LDL CHOLESTEROL, DIRECT: Direct LDL: 144 mg/dL

## 2019-03-05 ENCOUNTER — Other Ambulatory Visit: Payer: Self-pay

## 2019-03-05 MED ORDER — ATORVASTATIN CALCIUM 20 MG PO TABS: 20.0000 mg | ORAL_TABLET | Freq: Every day | ORAL | 2 refills | Status: DC

## 2019-03-06 NOTE — Progress Notes (Signed)
Subjective:   Patient ID: Belinda Cox, female   DOB: 58 y.o.   MRN: [id number]   HPI Patient presents today him doing very well with soreness but no real pain empirically so far   ROS      Objective:  Physical Exam  Neurovascular status intact negative Bevelyn Buckles' sign noted with patient's left foot healing well wound edges well coapted with mild edema consistent with postop no erythema or other pathology noted     Assessment:  Healing well from neuroma excision left     Plan:  H&P reviewed condition and recommended continued conservative care and reapplied sterile dressing and instructed on continued elevation compression and gradual increase in activity starting in the next several weeks.  Patient will be seen back and is encouraged to call with questions concerns

## 2019-03-12 ENCOUNTER — Other Ambulatory Visit: Payer: Self-pay

## 2019-03-12 ENCOUNTER — Ambulatory Visit
Admission: RE | Admit: 2019-03-12 | Discharge: 2019-03-12 | Disposition: A | Discharge status: Home / Self Care | Payer: BLUE CROSS/BLUE SHIELD | Source: Ambulatory Visit | Attending: Family Medicine | Admitting: Family Medicine

## 2019-03-12 DIAGNOSIS — Z1231 Encounter for screening mammogram for malignant neoplasm of breast: Secondary | ICD-10-CM

## 2019-03-19 ENCOUNTER — Encounter: Payer: BLUE CROSS/BLUE SHIELD | Admitting: Podiatry

## 2019-06-02 ENCOUNTER — Other Ambulatory Visit: Payer: Self-pay | Admitting: Family Medicine

## 2019-06-02 DIAGNOSIS — E7800 Pure hypercholesterolemia, unspecified: Secondary | ICD-10-CM

## 2019-06-02 NOTE — Telephone Encounter (Signed)
Cholesterol recheck appt scheduled for 06/03/19.  Patient believes she has a week or more RX left.  Will send in RX after labs are resulted.

## 2019-06-03 ENCOUNTER — Other Ambulatory Visit: Payer: Self-pay

## 2019-06-03 ENCOUNTER — Ambulatory Visit (INDEPENDENT_AMBULATORY_CARE_PROVIDER_SITE_OTHER): Payer: BLUE CROSS/BLUE SHIELD | Admitting: Family Medicine

## 2019-06-03 DIAGNOSIS — E7800 Pure hypercholesterolemia, unspecified: Secondary | ICD-10-CM

## 2019-06-03 NOTE — Addendum Note (Signed)
Addended by: Ralph Dowdy on: 06/03/2019 08:53 AM   Modules accepted: Orders

## 2019-06-04 ENCOUNTER — Encounter: Payer: Self-pay | Admitting: Family Medicine

## 2019-06-04 LAB — LIPID PANEL
Cholesterol: 157 mg/dL (ref <200)
HDL: 52 mg/dL (ref >=50)
LDL Cholesterol (Calc): 79 mg/dL (calc)
Non-HDL Cholesterol (Calc): 105 mg/dL (calc) (ref <130)
Total CHOL/HDL Ratio: 3 (calc) (ref <5.0)
Triglycerides: 161 mg/dL — ABNORMAL HIGH (ref <150)

## 2019-12-05 ENCOUNTER — Other Ambulatory Visit: Payer: Self-pay | Admitting: Family Medicine

## 2019-12-05 ENCOUNTER — Other Ambulatory Visit: Payer: Self-pay

## 2019-12-05 MED ORDER — HYDROCHLOROTHIAZIDE 25 MG PO TABS: 25.0000 mg | ORAL_TABLET | Freq: Every day | ORAL | 1 refills | Status: DC

## 2020-05-12 IMAGING — MG DIGITAL SCREENING BILAT W/ TOMO
8 series · 8 of 24 positions shown · non-contrast
Comparison: Previous exam(s).

ACR Breast Density Category a: The breast tissue is almost entirely
fatty.

CLINICAL DATA: Screening.

EXAM:
DIGITAL SCREENING BILATERAL MAMMOGRAM WITH TOMO AND CAD

[L MLO synth-2D]
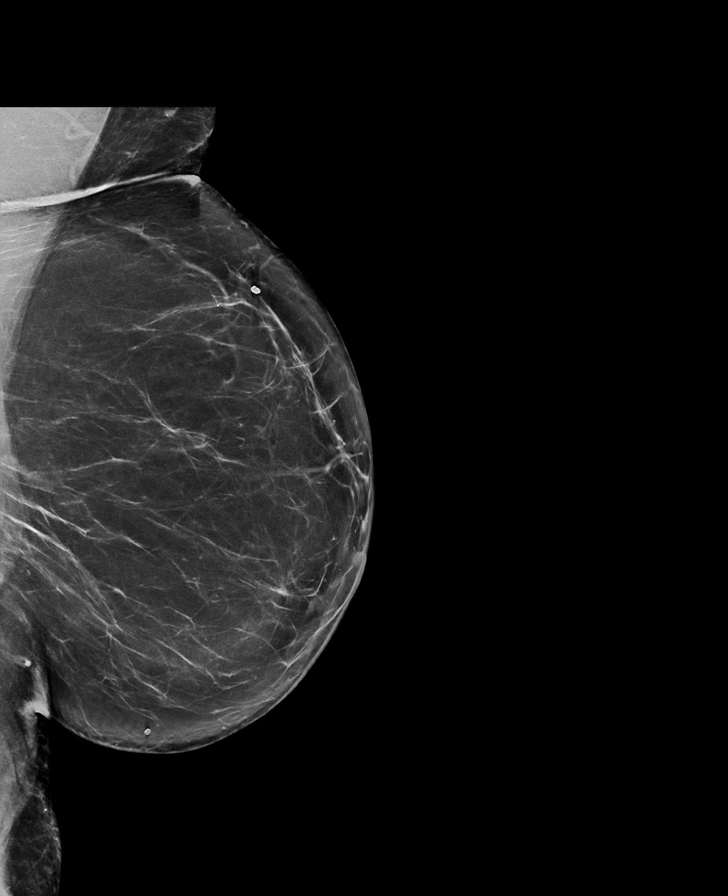

[R CC synth-2D]
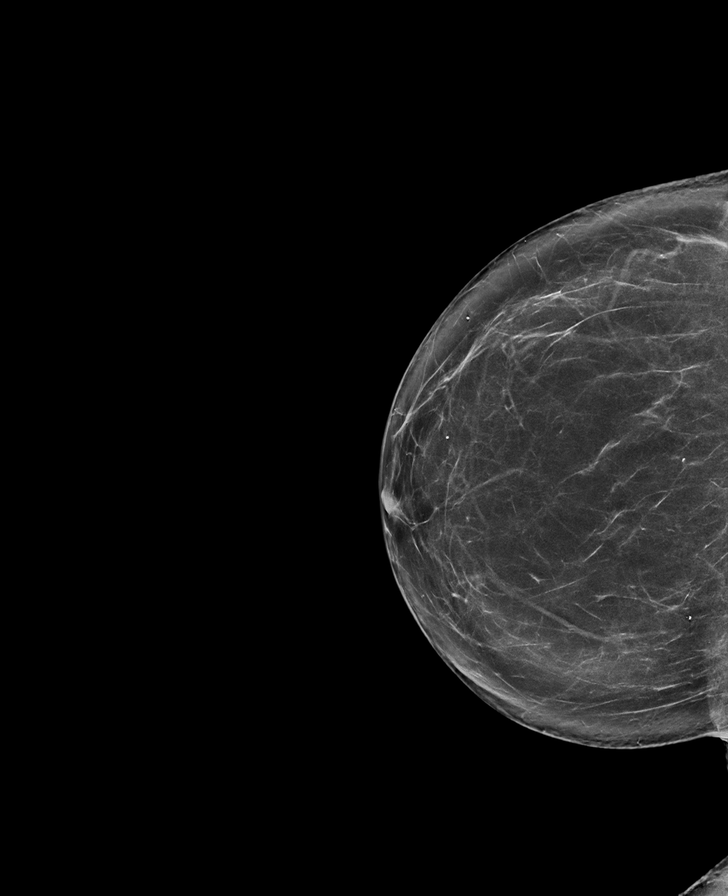

[L CC synth-2D]
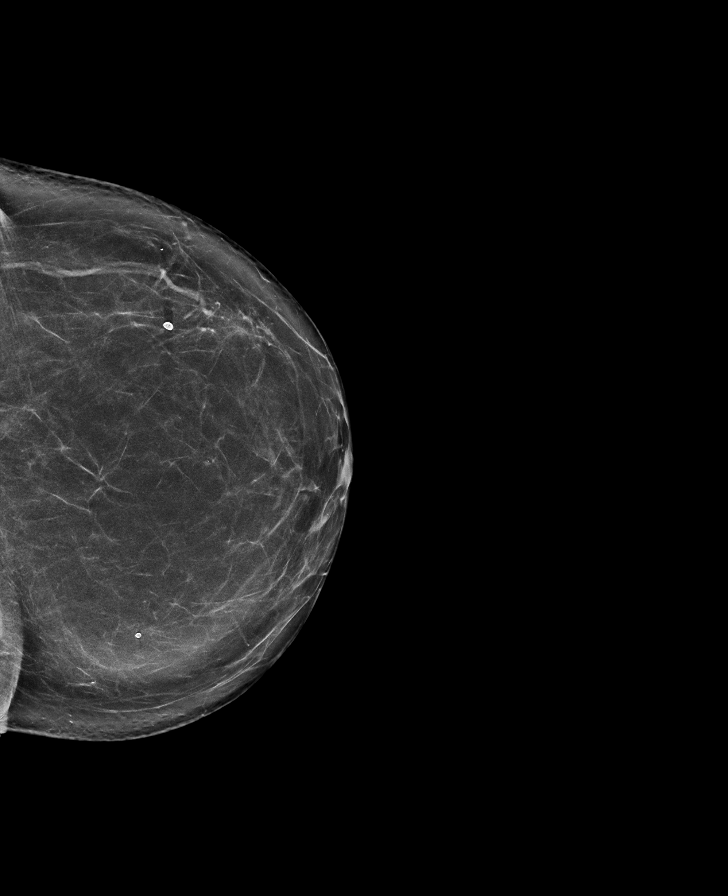

[R MLO synth-2D]
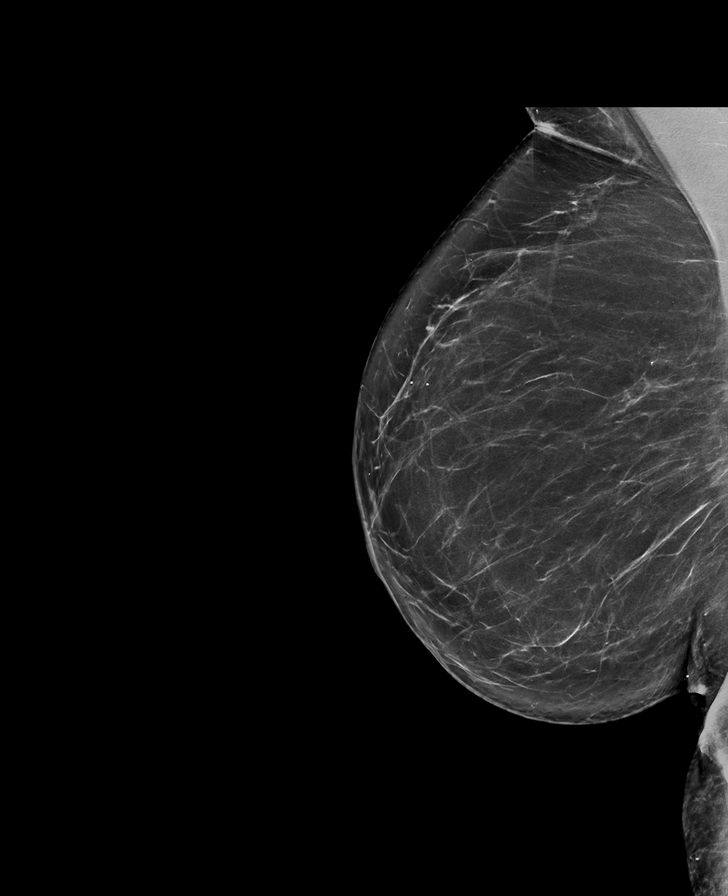

[L MLO tomo · tomo slice 42/83.0]
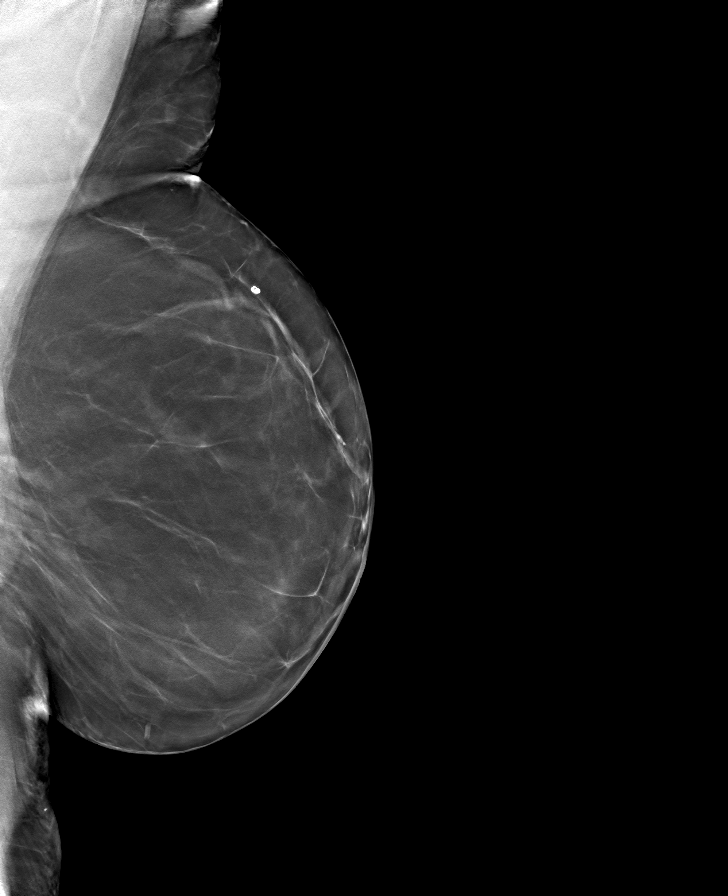

[R CC tomo · tomo slice 40/79.0]
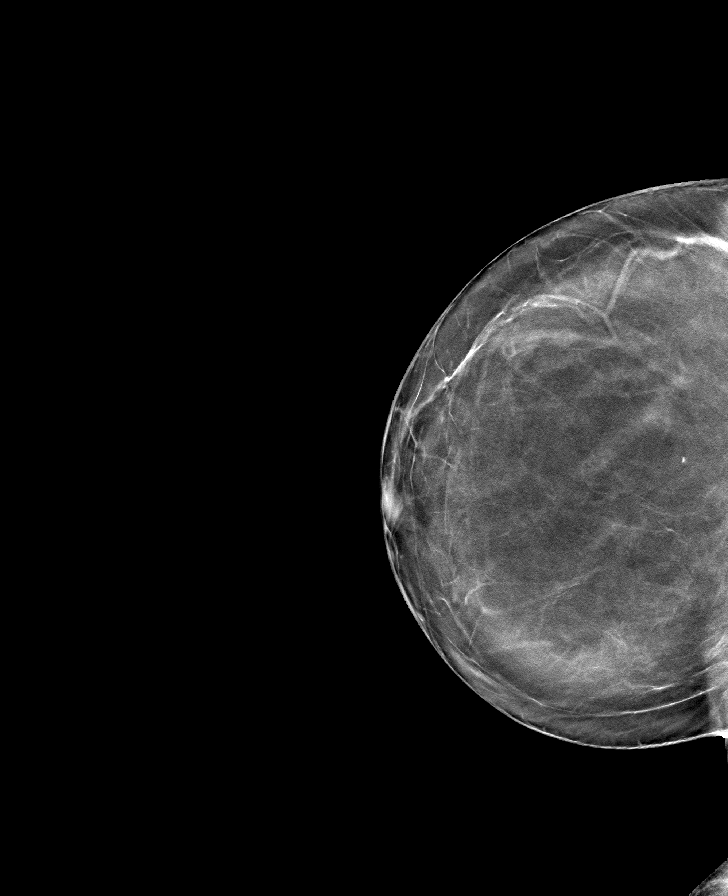

[L CC tomo · tomo slice 41/81.0]
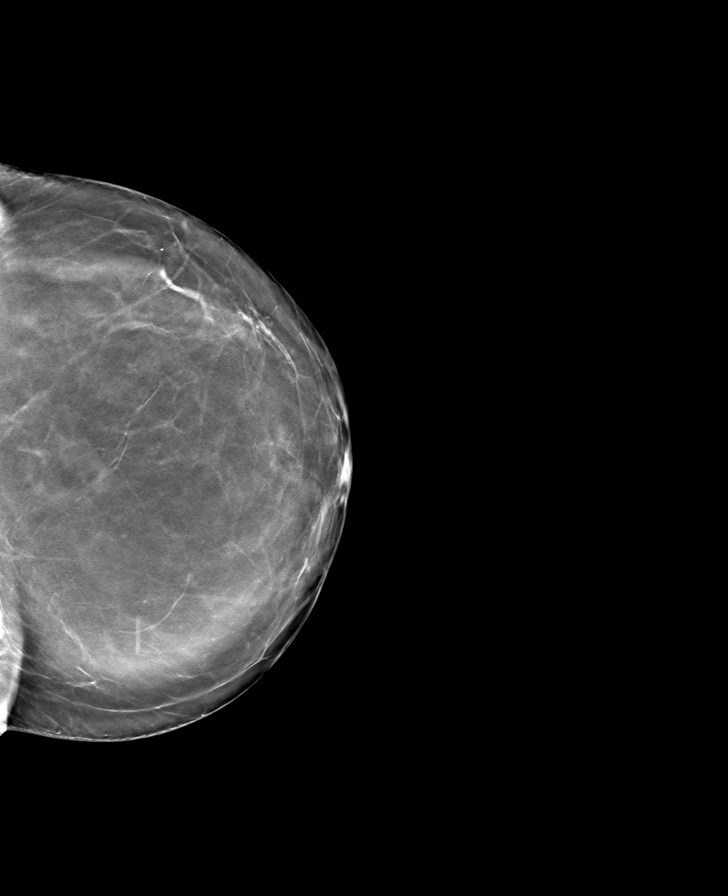

[R MLO tomo · tomo slice 40/79.0]
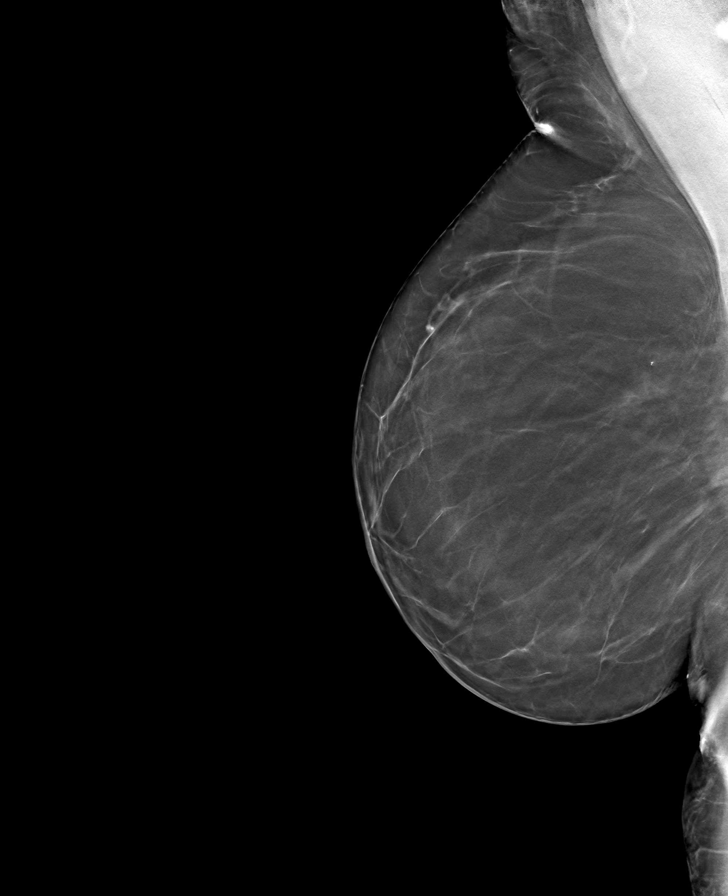

[8 of 24 positions shown; findings below may reference images not displayed]

FINDINGS: There are no findings suspicious for malignancy. Images were
processed with CAD.
IMPRESSION: No mammographic evidence of malignancy. A result letter of this
screening mammogram will be mailed directly to the patient.

RECOMMENDATION:
Screening mammogram in one year. (Code:[id number]-[id number])

BI-RADS CATEGORY  1: Negative.

## 2020-06-08 ENCOUNTER — Other Ambulatory Visit: Payer: Self-pay | Admitting: Family Medicine

## 2020-06-10 ENCOUNTER — Other Ambulatory Visit: Payer: Self-pay | Admitting: Family Medicine

## 2020-07-13 ENCOUNTER — Telehealth: Payer: Self-pay | Admitting: Family Medicine

## 2020-07-13 DIAGNOSIS — K760 Fatty (change of) liver, not elsewhere classified: Secondary | ICD-10-CM

## 2020-07-13 DIAGNOSIS — K8020 Calculus of gallbladder without cholecystitis without obstruction: Secondary | ICD-10-CM

## 2020-07-13 HISTORY — DX: Calculus of gallbladder without cholecystitis without obstruction: K80.20

## 2020-07-13 HISTORY — DX: Fatty (change of) liver, not elsewhere classified: K76.0

## 2020-07-13 NOTE — Telephone Encounter (Signed)
Patient called to check status of refill - she read on her mychart that her request was denied.  I told her that she needed an office appt.  Patient agreed and understood.  Scheduled first available with Dr. Anitra Lauth on  07/30/20 at 3:30PM  Patient has about 2 weeks of meds left - she will run out of meds before appt.  Please fill 30 d/s only.    hydrochlorothiazide (HYDRODIURIL) 25 MG tablet [[id number]]   atorvastatin (LIPITOR) 20 MG tablet [[id number]]    [id number] [place] - [address]

## 2020-07-14 ENCOUNTER — Other Ambulatory Visit: Payer: Self-pay

## 2020-07-14 MED ORDER — HYDROCHLOROTHIAZIDE 25 MG PO TABS: 25.0000 mg | ORAL_TABLET | Freq: Every day | ORAL | 0 refills | Status: DC

## 2020-07-14 MED ORDER — ATORVASTATIN CALCIUM 20 MG PO TABS: 20.0000 mg | ORAL_TABLET | Freq: Every day | ORAL | 0 refills | Status: DC

## 2020-07-14 NOTE — Telephone Encounter (Signed)
Left detailed message advising refills sent for 30 day supply only, okay per DPR.

## 2020-07-29 ENCOUNTER — Other Ambulatory Visit: Payer: Self-pay

## 2020-07-30 ENCOUNTER — Ambulatory Visit: Payer: BLUE CROSS/BLUE SHIELD | Admitting: Family Medicine

## 2020-07-30 ENCOUNTER — Encounter: Payer: Self-pay | Admitting: Family Medicine

## 2020-07-30 VITALS — BP 118/72 | HR 105 | Temp 98.2°F | Resp 16 | Ht 59.0 in | Wt 154.4 lb

## 2020-07-30 DIAGNOSIS — Z23 Encounter for immunization: Secondary | ICD-10-CM

## 2020-07-30 DIAGNOSIS — R7301 Impaired fasting glucose: Secondary | ICD-10-CM | POA: Diagnosis not present

## 2020-07-30 DIAGNOSIS — R7401 Elevation of levels of liver transaminase levels: Secondary | ICD-10-CM | POA: Diagnosis not present

## 2020-07-30 DIAGNOSIS — I10 Essential (primary) hypertension: Secondary | ICD-10-CM | POA: Diagnosis not present

## 2020-07-30 DIAGNOSIS — Z0000 Encounter for general adult medical examination without abnormal findings: Secondary | ICD-10-CM | POA: Diagnosis not present

## 2020-07-30 DIAGNOSIS — E7800 Pure hypercholesterolemia, unspecified: Secondary | ICD-10-CM | POA: Diagnosis not present

## 2020-07-30 MED ORDER — ATORVASTATIN CALCIUM 20 MG PO TABS: 20.0000 mg | ORAL_TABLET | Freq: Every day | ORAL | 3 refills | Status: DC

## 2020-07-30 MED ORDER — HYDROCHLOROTHIAZIDE 25 MG PO TABS: 25.0000 mg | ORAL_TABLET | Freq: Every day | ORAL | 3 refills | Status: DC

## 2020-07-30 NOTE — Progress Notes (Signed)
Office Note 07/30/2020  CC:  Chief Complaint  Patient presents with  . Follow-up    RCI, pt is not fasting   HPI:  Belinda Cox is a 60 y.o. White female who is here for annual health maintenance exam and f/u HTN, HLD, CRI II, IFG. I last saw her June 2020 for presurg clearance for Morton's neuroma surgery.  She was doing well at that time.  HTN: has been consistently 120s/80.  No HR data. Slim fast diet on/off for 5 yrs.  No exercise.  Taking care of grandkids. Drinks clear fluids all day long.  No sodas.  No NSAIDs.  Compliant with atorva 8m qd. She had a slimfast shake 3 hours ago.  Past Medical History:  Diagnosis Date  . Allergy    seasonal  . Amenorrhea   . Blood transfusion without reported diagnosis    Birth  . Chronic renal insufficiency, stage II (mild)    GFR 60s  . Colitis    focal area of chronic active colitis on colonoscopy 02/2019  . Elevated transaminase level    Viral hep serologies neg.  Suspect NASH.  .Marland KitchenHistory of abnormal Pap smear    AGUS; ASCUS, with high risk HPV+, (colpo neg 09/2012--repeat pap 12/16/15 nl, no HR HPV--per GYN needs pap and HR HPV testing again in 3 yrs))  . History of adenomatous polyp of colon 2015, 2020   Recall 02/2024  . Hyperlipemia, mixed 02/2019   Great response to atorva   . Hypertension   . IFG (impaired fasting glucose) 07/2016   HbA1c 5.5%  . Osteoarthritis    fingers  . Right foot pain    neuroma (Dr. RPaulla Dolly  . Seasonal allergic rhinitis 09/10/2012    Past Surgical History:  Procedure Laterality Date  . COLONOSCOPY W/ POLYPECTOMY  08/2013; 02/19/2019   02/2019 focal colitis (cecum).->path showed chronic active colitis.  Adenoma x 1. Recall 52yr. COLPOSCOPY    . POLYPECTOMY    . Unremarkable      Family History  Problem Relation Age of Onset  . Alcoholism Maternal Grandfather   . Alcoholism Brother   . Arthritis Father        Alive  . Hypertension Father   . Heart disease Maternal Grandmother    . Diabetes Maternal Grandmother   . Cancer - Other Maternal Grandmother        Intestinal  . Pancreatic cancer Maternal Grandmother   . Hypertension Mother        Alive  . Diabetes Mother   . Cancer Mother   . Uterine cancer Mother   . Diabetes Paternal Grandmother   . Leukemia Sister   . Colon cancer Neg Hx   . Rectal cancer Neg Hx   . Stomach cancer Neg Hx   . Colon polyps Neg Hx   . Esophageal cancer Neg Hx    Social History   Socioeconomic History  . Marital status: Married    Spouse name: Not on file  . Number of children: Not on file  . Years of education: Not on file  . Highest education level: Not on file  Occupational History  . Not on file  Tobacco Use  . Smoking status: Never Smoker  . Smokeless tobacco: Never Used  Vaping Use  . Vaping Use: Never used  Substance and Sexual Activity  . Alcohol use: No  . Drug use: No  . Sexual activity: Yes    Partners: Male    Birth control/protection:  None, Post-menopausal  Other Topics Concern  . Not on file  Social History Narrative   Married, has 2 grown children.   Orig from [place], [place].   Lives in [place], [place].   Occupation: early childhood ed Pharmacist, hospital.   No Tob/alc/drugs.   Social Determinants of Health   Financial Resource Strain: Not on file  Food Insecurity: Not on file  Transportation Needs: Not on file  Physical Activity: Not on file  Stress: Not on file  Social Connections: Not on file  Intimate Partner Violence: Not on file    Outpatient Medications Prior to Visit  Medication Sig Dispense Refill  . cetirizine (ZYRTEC) 10 MG tablet Take 10 mg by mouth daily.    Marland Kitchen atorvastatin (LIPITOR) 20 MG tablet Take 1 tablet (20 mg total) by mouth daily. 30 tablet 0  . hydrochlorothiazide (HYDRODIURIL) 25 MG tablet Take 1 tablet (25 mg total) by mouth daily. 30 tablet 0  . HYDROcodone-acetaminophen (NORCO) 10-325 MG tablet Dispensed #20 on 02/25/19 with no refills (Patient not taking: Reported on  07/30/2020)     No facility-administered medications prior to visit.    No Known Allergies  ROS Review of Systems  Constitutional: Negative for appetite change, chills, fatigue and fever.  HENT: Negative for congestion, dental problem, ear pain and sore throat.   Eyes: Negative for discharge, redness and visual disturbance.  Respiratory: Negative for cough, chest tightness, shortness of breath and wheezing.   Cardiovascular: Negative for chest pain, palpitations and leg swelling.  Gastrointestinal: Negative for abdominal pain, blood in stool, diarrhea, nausea and vomiting.  Genitourinary: Negative for difficulty urinating, dysuria, flank pain, frequency, hematuria and urgency.  Musculoskeletal: Negative for arthralgias, back pain, joint swelling, myalgias and neck stiffness.  Skin: Negative for pallor and rash.  Neurological: Negative for dizziness, speech difficulty, weakness and headaches.  Hematological: Negative for adenopathy. Does not bruise/bleed easily.  Psychiatric/Behavioral: Negative for confusion and sleep disturbance. The patient is not nervous/anxious.    PE; Vitals with BMI 07/30/2020 02/21/2019 02/21/2019  Height 4' 11"  - -  Weight 154 lbs 6 oz - -  BMI 51.70 - -  Systolic 017 494 496  Diastolic 72 72 72  Pulse 759 76 76   Exam chaperoned by Deveron Furlong, CMA.  Gen: Alert, well appearing.  Patient is oriented to person, place, time, and situation. AFFECT: pleasant, lucid thought and speech. ENT: Ears: EACs clear, normal epithelium.  TMs with good light reflex and landmarks bilaterally.  Eyes: no injection, icteris, swelling, or exudate.  EOMI, PERRLA. Nose: no drainage or turbinate edema/swelling.  No injection or focal lesion.  Mouth: lips without lesion/swelling.  Oral mucosa pink and moist.  Dentition intact and without obvious caries or gingival swelling.  Oropharynx without erythema, exudate, or swelling.  Neck: supple/nontender.  No LAD, mass, or TM.   Carotid pulses 2+ bilaterally, without bruits. CV: RRR, no m/r/g.   LUNGS: CTA bilat, nonlabored resps, good aeration in all lung fields. ABD: soft, NT, ND, BS normal.  No hepatospenomegaly or mass.  No bruits. EXT: no clubbing, cyanosis, or edema.  Musculoskeletal: no joint swelling, erythema, warmth, or tenderness.  ROM of all joints intact. Skin - no sores or suspicious lesions or rashes or color changes   Pertinent labs:  Lab Results  Component Value Date   TSH 1.41 02/24/2019   Lab Results  Component Value Date   WBC 5.8 02/24/2019   HGB 13.6 02/24/2019   HCT 40.3 02/24/2019   MCV  93.9 02/24/2019   PLT 254.0 02/24/2019   Lab Results  Component Value Date   CREATININE 1.13 02/24/2019   BUN 22 02/24/2019   NA 141 02/24/2019   K 3.8 02/24/2019   CL 103 02/24/2019   CO2 29 02/24/2019   Lab Results  Component Value Date   ALT 85 (H) 02/24/2019   AST 41 (H) 02/24/2019   ALKPHOS 91 02/24/2019   BILITOT 0.4 02/24/2019   Lab Results  Component Value Date   CHOL 157 06/03/2019   Lab Results  Component Value Date   HDL 52 06/03/2019   Lab Results  Component Value Date   LDLCALC 79 06/03/2019   Lab Results  Component Value Date   TRIG 161 (H) 06/03/2019   Lab Results  Component Value Date   CHOLHDL 3.0 06/03/2019   Lab Results  Component Value Date   HGBA1C 5.7 02/24/2019    ASSESSMENT AND PLAN:   1) HTN: well controlled on hctz 34m qd. Lytes/cr today.  2) HLD: tolerating atorva 229mqd. FLP and hepatic panel today.  3) IFG: working on diet.  Encouraged exercise. Hba1c today.  4) CRI II: GFR 60s. Avoids NSAIDs, hydrates very well. Lytes/cr today.  5) Health maintenance exam: Reviewed age and gender appropriate health maintenance issues (prudent diet, regular exercise, health risks of tobacco and excessive alcohol, use of seatbelts, fire alarms in home, use of sunscreen).  Also reviewed age and gender appropriate health screening as well as  vaccine recommendations. Vaccines: shingrix #1 today. Labs: cbc, cmet, flp, tsh, a1c. Cervical ca screening: pap normal 02/20/19 at GYN.  Pt to schedule routine f/u. Breast ca screening: mammogram normal 02/20/19 at GYN.  Pt to schedule routine f/u. Colon ca screening: Recall 02/2024.  An After Visit Summary was printed and given to the patient.  FOLLOW UP:  Return in about 6 months (around 01/30/2021) for routine chronic illness f/u.  Signed:  PhCrissie SicklesMD           07/30/2020

## 2020-07-30 NOTE — Patient Instructions (Signed)
Health Maintenance, Female Adopting a healthy lifestyle and getting preventive care are important in promoting health and wellness. Ask your health care provider about:  The right schedule for you to have regular tests and exams.  Things you can do on your own to prevent diseases and keep yourself healthy. What should I know about diet, weight, and exercise? Eat a healthy diet  Eat a diet that includes plenty of vegetables, fruits, low-fat dairy products, and lean protein.  Do not eat a lot of foods that are high in solid fats, added sugars, or sodium.   Maintain a healthy weight Body mass index (BMI) is used to identify weight problems. It estimates body fat based on height and weight. Your health care provider can help determine your BMI and help you achieve or maintain a healthy weight. Get regular exercise Get regular exercise. This is one of the most important things you can do for your health. Most adults should:  Exercise for at least 150 minutes each week. The exercise should increase your heart rate and make you sweat (moderate-intensity exercise).  Do strengthening exercises at least twice a week. This is in addition to the moderate-intensity exercise.  Spend less time sitting. Even light physical activity can be beneficial. Watch cholesterol and blood lipids Have your blood tested for lipids and cholesterol at 61 years of age, then have this test every 5 years. Have your cholesterol levels checked more often if:  Your lipid or cholesterol levels are high.  You are older than 61 years of age.  You are at high risk for heart disease. What should I know about cancer screening? Depending on your health history and family history, you may need to have cancer screening at various ages. This may include screening for:  Breast cancer.  Cervical cancer.  Colorectal cancer.  Skin cancer.  Lung cancer. What should I know about heart disease, diabetes, and high blood  pressure? Blood pressure and heart disease  High blood pressure causes heart disease and increases the risk of stroke. This is more likely to develop in people who have high blood pressure readings, are of African descent, or are overweight.  Have your blood pressure checked: ? Every 3-5 years if you are 18-39 years of age. ? Every year if you are 40 years old or older. Diabetes Have regular diabetes screenings. This checks your fasting blood sugar level. Have the screening done:  Once every three years after age 40 if you are at a normal weight and have a low risk for diabetes.  More often and at a younger age if you are overweight or have a high risk for diabetes. What should I know about preventing infection? Hepatitis B If you have a higher risk for hepatitis B, you should be screened for this virus. Talk with your health care provider to find out if you are at risk for hepatitis B infection. Hepatitis C Testing is recommended for:  Everyone born from 1945 through 1965.  Anyone with known risk factors for hepatitis C. Sexually transmitted infections (STIs)  Get screened for STIs, including gonorrhea and chlamydia, if: ? You are sexually active and are younger than 61 years of age. ? You are older than 61 years of age and your health care provider tells you that you are at risk for this type of infection. ? Your sexual activity has changed since you were last screened, and you are at increased risk for chlamydia or gonorrhea. Ask your health care provider   if you are at risk.  Ask your health care provider about whether you are at high risk for HIV. Your health care provider may recommend a prescription medicine to help prevent HIV infection. If you choose to take medicine to prevent HIV, you should first get tested for HIV. You should then be tested every 3 months for as long as you are taking the medicine. Pregnancy  If you are about to stop having your period (premenopausal) and  you may become pregnant, seek counseling before you get pregnant.  Take 400 to 800 micrograms (mcg) of folic acid every day if you become pregnant.  Ask for birth control (contraception) if you want to prevent pregnancy. Osteoporosis and menopause Osteoporosis is a disease in which the bones lose minerals and strength with aging. This can result in bone fractures. If you are 65 years old or older, or if you are at risk for osteoporosis and fractures, ask your health care provider if you should:  Be screened for bone loss.  Take a calcium or vitamin D supplement to lower your risk of fractures.  Be given hormone replacement therapy (HRT) to treat symptoms of menopause. Follow these instructions at home: Lifestyle  Do not use any products that contain nicotine or tobacco, such as cigarettes, e-cigarettes, and chewing tobacco. If you need help quitting, ask your health care provider.  Do not use street drugs.  Do not share needles.  Ask your health care provider for help if you need support or information about quitting drugs. Alcohol use  Do not drink alcohol if: ? Your health care provider tells you not to drink. ? You are pregnant, may be pregnant, or are planning to become pregnant.  If you drink alcohol: ? Limit how much you use to 0-1 drink a day. ? Limit intake if you are breastfeeding.  Be aware of how much alcohol is in your drink. In the [place] one drink equals one 12 oz bottle of beer (355 mL), one 5 oz glass of wine (148 mL), or one 1 oz glass of hard liquor (44 mL). General instructions  Schedule regular health, dental, and eye exams.  Stay current with your vaccines.  Tell your health care provider if: ? You often feel depressed. ? You have ever been abused or do not feel safe at home. Summary  Adopting a healthy lifestyle and getting preventive care are important in promoting health and wellness.  Follow your health care provider's instructions about healthy  diet, exercising, and getting tested or screened for diseases.  Follow your health care provider's instructions on monitoring your cholesterol and blood pressure. This information is not intended to replace advice given to you by your health care provider. Make sure you discuss any questions you have with your health care provider. Document Revised: 04/24/2018 Document Reviewed: 04/24/2018 Elsevier Patient Education  2021 [employer]vier Inc.  

## 2020-07-30 NOTE — Addendum Note (Signed)
Addended by: Deveron Furlong D on: 07/30/2020 03:28 PM   Modules accepted: Orders

## 2020-07-31 LAB — COMPREHENSIVE METABOLIC PANEL
AG Ratio: 1.6 (calc) (ref 1.0–2.5)
ALT: 99 U/L — ABNORMAL HIGH (ref 6–29)
AST: 61 U/L — ABNORMAL HIGH (ref 10–35)
Albumin: 4.7 g/dL (ref 3.6–5.1)
Alkaline phosphatase (APISO): 113 U/L (ref 37–153)
BUN: 21 mg/dL (ref 7–25)
CO2: 27 mmol/L (ref 20–32)
Calcium: 10.3 mg/dL (ref 8.6–10.4)
Chloride: 99 mmol/L (ref 98–110)
Creat: 0.98 mg/dL (ref 0.50–0.99)
Globulin: 3 g/dL (calc) (ref 1.9–3.7)
Glucose, Bld: 117 mg/dL — ABNORMAL HIGH (ref 65–99)
Potassium: 3.2 mmol/L — ABNORMAL LOW (ref 3.5–5.3)
Sodium: 140 mmol/L (ref 135–146)
Total Bilirubin: 0.5 mg/dL (ref 0.2–1.2)
Total Protein: 7.7 g/dL (ref 6.1–8.1)

## 2020-07-31 LAB — LIPID PANEL
Cholesterol: 158 mg/dL (ref <200)
HDL: 56 mg/dL (ref >=50)
LDL Cholesterol (Calc): 78 mg/dL (calc)
Non-HDL Cholesterol (Calc): 102 mg/dL (calc) (ref <130)
Total CHOL/HDL Ratio: 2.8 (calc) (ref <5.0)
Triglycerides: 147 mg/dL (ref <150)

## 2020-07-31 LAB — TSH: TSH: 1.82 mIU/L (ref 0.40–4.50)

## 2020-07-31 LAB — CBC WITH DIFFERENTIAL/PLATELET
Absolute Monocytes: 533 cells/uL (ref 200–950)
Basophils Absolute: 59 cells/uL (ref 0–200)
Basophils Relative: 0.8 %
Eosinophils Absolute: 148 cells/uL (ref 15–500)
Eosinophils Relative: 2 %
HCT: 41.8 % (ref 35.0–45.0)
Hemoglobin: 14.9 g/dL (ref 11.7–15.5)
Lymphs Abs: 2745 cells/uL (ref 850–3900)
MCH: 31.8 pg (ref 27.0–33.0)
MCHC: 35.6 g/dL (ref 32.0–36.0)
MCV: 89.1 fL (ref 80.0–100.0)
MPV: 9.5 fL (ref 7.5–12.5)
Monocytes Relative: 7.2 %
Neutro Abs: 3915 cells/uL (ref 1500–7800)
Neutrophils Relative %: 52.9 %
Platelets: 333 10*3/uL (ref 140–400)
RBC: 4.69 10*6/uL (ref 3.80–5.10)
RDW: 12.8 % (ref 11.0–15.0)
Total Lymphocyte: 37.1 %
WBC: 7.4 10*3/uL (ref 3.8–10.8)

## 2020-07-31 LAB — HEMOGLOBIN A1C
Hgb A1c MFr Bld: 5.4 % of total Hgb (ref <5.7)
Mean Plasma Glucose: 108 mg/dL
eAG (mmol/L): 6 mmol/L

## 2020-08-02 ENCOUNTER — Other Ambulatory Visit: Payer: Self-pay | Admitting: Family Medicine

## 2020-08-02 ENCOUNTER — Other Ambulatory Visit: Payer: Self-pay

## 2020-08-02 DIAGNOSIS — R7401 Elevation of levels of liver transaminase levels: Secondary | ICD-10-CM

## 2020-08-02 MED ORDER — POTASSIUM CHLORIDE CRYS ER 20 MEQ PO TBCR: 20.0000 meq | EXTENDED_RELEASE_TABLET | Freq: Every day | ORAL | 3 refills | Status: DC

## 2020-08-06 ENCOUNTER — Ambulatory Visit (HOSPITAL_BASED_OUTPATIENT_CLINIC_OR_DEPARTMENT_OTHER)
Admission: RE | Admit: 2020-08-06 | Discharge: 2020-08-06 | Disposition: A | Discharge status: Home / Self Care | Payer: BLUE CROSS/BLUE SHIELD | Source: Ambulatory Visit | Attending: Family Medicine | Admitting: Family Medicine

## 2020-08-06 ENCOUNTER — Other Ambulatory Visit: Payer: Self-pay

## 2020-08-06 DIAGNOSIS — R7401 Elevation of levels of liver transaminase levels: Secondary | ICD-10-CM | POA: Diagnosis not present

## 2020-08-08 ENCOUNTER — Encounter: Payer: Self-pay | Admitting: Family Medicine

## 2020-12-07 ENCOUNTER — Telehealth: Payer: Self-pay

## 2020-12-07 NOTE — Telephone Encounter (Signed)
Spoke with pt's husband, Jenny Reichmann (DPR); currently in [place] for evaluation. She has had imaging and bruising noted.

## 2020-12-07 NOTE — Telephone Encounter (Signed)
[facility] - Client TELEPHONE ADVICE RECORD [facility] Patient Name: Belinda Cox Gender: Female DOB: Dec 14, 1960 Age: 60 Y 10 M 20 D Return Phone Number: [phone] (Primary) Address: City/ State/ Zip: [place] [address] Client [facility] - Client Client Site [facility] - Day Physician Crissie Sickles - MD Contact Type Call Who Is Calling Patient / Member / Family / Caregiver Call Type Triage / Clinical Relationship To Patient Self Return Phone Number [phone] (Primary) Chief Complaint CHEST PAIN - pain, pressure, heaviness or tightness Reason for Call Symptomatic / Request for [place] states she wanted to schedule an appt. Caller states she fell this morning and she is having pain on the left side of your chest. Caller states when she fell her arm went into her chest when she hit the ground. [facility] Translation No Nurse Assessment Nurse: Rolin Barry, RN, Levada Dy Date/Time Eilene Ghazi Time): 12/07/2020 12:45:38 PM Confirm and document reason for call. If symptomatic, describe symptoms. ---Caller states she wanted to schedule an appt. Caller states she fell this morning and she is having pain on the left side of your chest. Caller states when she fell her arm went into her chest when she hit the ground. Hurts to move and breathe. Tripped over her purse, left side chest pain. Went to an UC, advised that it would be a 3 hour wait. Does the patient have any new or worsening symptoms? ---Yes Will a triage be completed? ---Yes Related visit to physician within the last 2 weeks? ---No Does the PT have any chronic conditions? (i.e. diabetes, asthma, this includes High risk factors for pregnancy, etc.) ---Yes List chronic conditions. ---HTN Is this a behavioral health or substance abuse call? ---No PLEASE NOTE: All timestamps  contained within this report are represented as [place] Standard Time. CONFIDENTIALTY NOTICE: This fax transmission is intended only for the addressee. It contains information that is legally privileged, confidential or otherwise protected from use or disclosure. If you are not the intended recipient, you are strictly prohibited from reviewing, disclosing, copying using or disseminating any of this information or taking any action in reliance on or regarding this information. If you have received this fax in error, please notify us immediately by telephone so that we can arrange for its return to [place]. Phone: [phone], Toll-Free: [phone], Fax: [phone] Page: 2 of 2 Call Id: [id number] Guidelines Guideline Title Affirmed Question Affirmed Notes Nurse Date/Time Eilene Ghazi Time) Chest Injury [1] Difficulty breathing AND [2] not severe Rolin Barry, RN, Levada Dy 12/07/2020 12:47:38 PM Disp. Time Eilene Ghazi Time) Disposition Final User 12/07/2020 12:44:04 PM Send to Urgent Queue Josephine Cables 12/07/2020 12:51:19 PM Go to ED Now Yes Deaton, RN, Cindee Lame Disagree/Comply Comply Caller Understands Yes PreDisposition Did not know what to do Care Advice Given Per Guideline GO TO ED NOW: * You need to be seen in the Emergency Department. * Go to the ED at ___________ [facility] now. Drive carefully. CARE ADVICE given per Chest Injury (Adult) guideline. Comments User: Saverio Danker, RN Date/Time Eilene Ghazi Time): 12/07/2020 12:52:49 PM Caller advised that her daughter is with her and that she will take her. Referrals GO TO FACILITY OTHER - SPECIFY

## 2021-05-29 ENCOUNTER — Other Ambulatory Visit: Payer: Self-pay | Admitting: Family Medicine

## 2021-06-19 ENCOUNTER — Other Ambulatory Visit: Payer: Self-pay | Admitting: Family Medicine

## 2021-06-28 ENCOUNTER — Other Ambulatory Visit: Payer: Self-pay | Admitting: Family Medicine

## 2021-07-06 ENCOUNTER — Other Ambulatory Visit: Payer: Self-pay

## 2021-07-07 ENCOUNTER — Ambulatory Visit: Payer: BLUE CROSS/BLUE SHIELD | Admitting: Family Medicine

## 2021-07-07 ENCOUNTER — Encounter: Payer: Self-pay | Admitting: Family Medicine

## 2021-07-07 VITALS — BP 155/95 | HR 108 | Temp 99.2°F | Ht 59.0 in | Wt 153.6 lb

## 2021-07-07 DIAGNOSIS — K760 Fatty (change of) liver, not elsewhere classified: Secondary | ICD-10-CM | POA: Diagnosis not present

## 2021-07-07 DIAGNOSIS — I10 Essential (primary) hypertension: Secondary | ICD-10-CM | POA: Diagnosis not present

## 2021-07-07 DIAGNOSIS — N182 Chronic kidney disease, stage 2 (mild): Secondary | ICD-10-CM

## 2021-07-07 DIAGNOSIS — J019 Acute sinusitis, unspecified: Secondary | ICD-10-CM

## 2021-07-07 DIAGNOSIS — E7800 Pure hypercholesterolemia, unspecified: Secondary | ICD-10-CM

## 2021-07-07 DIAGNOSIS — R7401 Elevation of levels of liver transaminase levels: Secondary | ICD-10-CM | POA: Diagnosis not present

## 2021-07-07 MED ORDER — ATORVASTATIN CALCIUM 20 MG PO TABS: 20.0000 mg | ORAL_TABLET | Freq: Every day | ORAL | 1 refills | Status: DC

## 2021-07-07 MED ORDER — HYDROCHLOROTHIAZIDE 25 MG PO TABS: 25.0000 mg | ORAL_TABLET | Freq: Every day | ORAL | 1 refills | Status: DC

## 2021-07-07 MED ORDER — POTASSIUM CHLORIDE CRYS ER 20 MEQ PO TBCR: 20.0000 meq | EXTENDED_RELEASE_TABLET | Freq: Every day | ORAL | 1 refills | Status: DC

## 2021-07-07 MED ORDER — AMOXICILLIN 875 MG PO TABS: 875.0000 mg | ORAL_TABLET | Freq: Two times a day (BID) | ORAL | 0 refills | Status: AC

## 2021-07-07 NOTE — Progress Notes (Signed)
OFFICE VISIT  07/07/2021  CC:  Chief Complaint  Patient presents with   Medication Refill    Sinus issues/ tested for covid negative/ joint pain   Patient is a 61 y.o. female who presents for f/u HTN, HLD, IFG, CRI II, fatty liver.  HPI: Reports about 2-week history of nasal congestion with postnasal drip.  Had some cough and low-grade fever initially but none in the last week.  Having some sinus headaches.  Ears popping.  Home COVID test negative. Taking some Mucinex.  No decongestants.  No wheezing or shortness of breath. She has been exposed to a GI virus recently and she had 1 episode of vomiting last night but is felt fine since.  She does not monitor her blood pressure at home.  She is compliant with blood pressure medications.  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  She to her appointment today straight from taking care of them.  ROS as above, plus-->  no dizziness,  no rashes, no melena/hematochezia.  No polyuria or polydipsia.  No myalgias or arthralgias.  No focal weakness, paresthesias, or tremors.  No acute vision or hearing abnormalities.  No dysuria or unusual/new urinary urgency or frequency.  No recent changes in lower legs. No abdominal pain or diarrhea.  No palpitations.     Past Medical History:  Diagnosis Date   Asymptomatic cholelithiasis 07/2020   Chronic renal insufficiency, stage II (mild)    GFR 60s   Elevated transaminase level    Viral hep serologies neg. Fatty liver on u/s 07/2020.   Fatty liver 07/2020   u/s documented   History of abnormal Pap smear    AGUS; ASCUS, with high risk HPV+, (colpo neg 09/2012--repeat pap 12/16/15 nl, no HR HPV--per GYN needs pap and HR HPV testing again in 3 yrs))   History of adenomatous polyp of colon 2015, 2020   Recall 02/2024   History of colitis    focal area of chronic active colitis on colonoscopy 02/2019   Hyperlipemia, mixed 02/2019   Great response to atorva    Hypertension     IFG (impaired fasting glucose) 07/2016   HbA1c 5.5%   Osteoarthritis    fingers   Seasonal allergic rhinitis 09/10/2012    Past Surgical History:  Procedure Laterality Date   COLONOSCOPY W/ POLYPECTOMY  08/2013; 02/19/2019   02/2019 focal colitis (cecum).->path showed chronic active colitis.  Adenoma x 1. Recall 23yr  COLPOSCOPY     POLYPECTOMY     Unremarkable      Outpatient Medications Prior to Visit  Medication Sig Dispense Refill   cetirizine (ZYRTEC) 10 MG tablet Take 10 mg by mouth daily.     HYDROcodone-acetaminophen (NORCO) 10-325 MG tablet Dispensed #20 on 02/25/19 with no refills     atorvastatin (LIPITOR) 20 MG tablet Take 1 tablet (20 mg total) by mouth daily. OFFICE VISIT NEEDED FOR FURTHER REFILLS 30 tablet 0   hydrochlorothiazide (HYDRODIURIL) 25 MG tablet Take 1 tablet (25 mg total) by mouth daily. 90 tablet 3   potassium chloride SA (KLOR-CON M20) 20 MEQ tablet Take 1 tablet (20 mEq total) by mouth daily. OFFICE VISIT NEEDED FOR FURTHER REFILLS 30 tablet 0   No facility-administered medications prior to visit.    No Known Allergies  ROS As per HPI  PE: Vitals with BMI 07/07/2021 07/30/2020 02/21/2019  Height 4' 11"  4' 11"  -  Weight 153 lbs 10 oz 154 lbs 6 oz -  BMI  73.41 93.79 -  Systolic 024 097 353  Diastolic 95 72 72  Pulse 299 105 76  02 sat 96% on RA today  Physical Exam  VS: noted--normal. Gen: alert, NAD, NONTOXIC APPEARING. HEENT: eyes without injection, drainage, or swelling.  Ears: EACs clear, TMs with normal light reflex and landmarks.  Nose: Clear rhinorrhea, with some dried, crusty exudate adherent to mildly injected mucosa.  No purulent d/c.  No paranasal sinus TTP.  No facial swelling.  Throat and mouth without focal lesion.  No pharyngial swelling, erythema, or exudate.   Neck: supple, no LAD.   LUNGS: CTA bilat, nonlabored resps.   CV: RRR (rate 95), no m/r/g. EXT: no c/c/e SKIN: no rash   LABS:  Last CBC Lab Results  Component  Value Date   WBC 7.4 07/30/2020   HGB 14.9 07/30/2020   HCT 41.8 07/30/2020   MCV 89.1 07/30/2020   MCH 31.8 07/30/2020   RDW 12.8 07/30/2020   PLT 333 24/26/8341   Last metabolic panel Lab Results  Component Value Date   GLUCOSE 117 (H) 07/30/2020   NA 140 07/30/2020   K 3.2 (L) 07/30/2020   CL 99 07/30/2020   CO2 27 07/30/2020   BUN 21 07/30/2020   CREATININE 0.98 07/30/2020   CALCIUM 10.3 07/30/2020   PROT 7.7 07/30/2020   ALBUMIN 4.3 02/24/2019   BILITOT 0.5 07/30/2020   ALKPHOS 91 02/24/2019   AST 61 (H) 07/30/2020   ALT 99 (H) 07/30/2020   Last lipids Lab Results  Component Value Date   CHOL 158 07/30/2020   HDL 56 07/30/2020   LDLCALC 78 07/30/2020   LDLDIRECT 144.0 03/04/2019   TRIG 147 07/30/2020   CHOLHDL 2.8 07/30/2020   Last hemoglobin A1c Lab Results  Component Value Date   HGBA1C 5.4 07/30/2020   Last thyroid functions Lab Results  Component Value Date   TSH 1.82 07/30/2020   T3TOTAL 140.0 07/28/2016   IMPRESSION AND PLAN:  #1 acute sinusitis. Amoxicillin 875 twice daily x10 days. Saline nasal spray as needed.  Avoid over-the-counter decongestants.  2.  Hypertension.  Elevated blood pressure today.  Unclear control overall. Continue HCTZ 25 mg a day.  Monitor blood pressure and heart rate daily at home and return in 2 weeks to review these with me.  If we need to add med it will be a beta-blocker. Electrolytes and creatinine today.  3.  Fatty liver with history of elevated transaminases. Hepatic panel today. Check lipid panel when fasting at next opportunity  4 hyperlipidemia.  Tolerating atorvastatin 20 mg a day. LDL was 78 about 11 months ago.  She is not fasting today.  #5 chronic renal insufficiency stage II/III. Avoiding NSAIDs.  Encourage good hydration. Monitor electrolytes and creatinine today.  An After Visit Summary was printed and given to the patient.  FOLLOW UP: Return in about 2 weeks (around 07/21/2021) for f/u  HTN.  Signed:  Crissie Sickles, MD           07/07/2021

## 2021-07-08 LAB — COMPREHENSIVE METABOLIC PANEL
ALT: 73 U/L — ABNORMAL HIGH (ref 0–35)
AST: 38 U/L — ABNORMAL HIGH (ref 0–37)
Albumin: 4.4 g/dL (ref 3.5–5.2)
Alkaline Phosphatase: 103 U/L (ref 39–117)
BUN: 15 mg/dL (ref 6–23)
CO2: 34 mEq/L — ABNORMAL HIGH (ref 19–32)
Calcium: 9.7 mg/dL (ref 8.4–10.5)
Chloride: 98 mEq/L (ref 96–112)
Creatinine, Ser: 1.03 mg/dL (ref 0.40–1.20)
GFR: 58.7 mL/min — ABNORMAL LOW (ref >60.00)
Glucose, Bld: 107 mg/dL — ABNORMAL HIGH (ref 70–99)
Potassium: 3 mEq/L — ABNORMAL LOW (ref 3.5–5.1)
Sodium: 138 mEq/L (ref 135–145)
Total Bilirubin: 0.7 mg/dL (ref 0.2–1.2)
Total Protein: 7.5 g/dL (ref 6.0–8.3)

## 2021-07-11 ENCOUNTER — Telehealth: Payer: Self-pay

## 2021-07-11 NOTE — Telephone Encounter (Signed)
-----   Message from Tammi Sou, MD sent at 07/11/2021  8:02 AM EST ----- Liver tests stable.  Kidney function stable and electrolytes normal except potassium low. Take TWO potassium 20 meq tabs per day every day. Rx for Klor-con 20 meq tabs, 2 tabs po qd, #60, RF x 6 Keep plan for f/u with me in about 2 wks for f/u HTN and we'll recheck this lab at that time.

## 2021-07-21 ENCOUNTER — Encounter: Payer: Self-pay | Admitting: Family Medicine

## 2021-07-21 ENCOUNTER — Other Ambulatory Visit: Payer: Self-pay

## 2021-07-21 ENCOUNTER — Ambulatory Visit: Payer: BLUE CROSS/BLUE SHIELD | Admitting: Family Medicine

## 2021-07-21 VITALS — BP 133/81 | HR 99 | Temp 98.2°F | Ht 59.0 in | Wt 157.6 lb

## 2021-07-21 DIAGNOSIS — Z23 Encounter for immunization: Secondary | ICD-10-CM

## 2021-07-21 DIAGNOSIS — E876 Hypokalemia: Secondary | ICD-10-CM

## 2021-07-21 DIAGNOSIS — I10 Essential (primary) hypertension: Secondary | ICD-10-CM

## 2021-07-21 DIAGNOSIS — E7800 Pure hypercholesterolemia, unspecified: Secondary | ICD-10-CM

## 2021-07-21 NOTE — Progress Notes (Signed)
OFFICE VISIT ? ?07/21/2021 ? ?CC:  ?Chief Complaint  ?Patient presents with  ? Hypertension  ?  Pt is not fasting  ? ? ? ?Patient is a 61 y.o. female who presents for 2 wk f/u HTN. ?A/P as of last visit: ?"#1 acute sinusitis. ?Amoxicillin 875 twice daily x10 days. ?Saline nasal spray as needed.  Avoid over-the-counter decongestants. ? ?2.  Hypertension.  Elevated blood pressure today.  Unclear control overall. ?Continue HCTZ 25 mg a day.  Monitor blood pressure and heart rate daily at home and return in 2 weeks to review these with me.  If we need to add med it will be a beta-blocker. ?Electrolytes and creatinine today. ? ?3.  Fatty liver with history of elevated transaminases. ?Hepatic panel today. ?Check lipid panel when fasting at next opportunity ?  ?4 hyperlipidemia.  Tolerating atorvastatin 20 mg a day. ?LDL was 78 about 11 months ago.  She is not fasting today. ?  ?#5 chronic renal insufficiency stage II/III. ?Avoiding NSAIDs.  Encourage good hydration. ?Monitor electrolytes and creatinine today." ? ?INTERIM HX: ?Last visit potassium was low, started 40 mEQ qd. ?She feels well. ?Home blood pressures have consistently been in the 120s over 70s.  Heart rate 70s to 80s. ? ? ?Past Medical History:  ?Diagnosis Date  ? Asymptomatic cholelithiasis 07/2020  ? Chronic renal insufficiency, stage II (mild)   ? GFR 60s  ? Elevated transaminase level   ? Viral hep serologies neg. Fatty liver on u/s 07/2020.  ? Fatty liver 07/2020  ? u/s documented  ? History of abnormal Pap smear   ? AGUS; ASCUS, with high risk HPV+, (colpo neg 09/2012--repeat pap 12/16/15 nl, no HR HPV--per GYN needs pap and HR HPV testing again in 3 yrs))  ? History of adenomatous polyp of colon 2015, 2020  ? Recall 02/2024  ? History of colitis   ? focal area of chronic active colitis on colonoscopy 02/2019  ? Hyperlipemia, mixed 02/2019  ? Great response to atorva   ? Hypertension   ? IFG (impaired fasting glucose) 07/2016  ? HbA1c 5.5%  ? Osteoarthritis    ? fingers  ? Seasonal allergic rhinitis 09/10/2012  ? ? ?Past Surgical History:  ?Procedure Laterality Date  ? COLONOSCOPY W/ POLYPECTOMY  08/2013; 02/19/2019  ? 02/2019 focal colitis (cecum).->path showed chronic active colitis.  Adenoma x 1. Recall 24yr ? COLPOSCOPY    ? POLYPECTOMY    ? Unremarkable    ? ? ?Outpatient Medications Prior to Visit  ?Medication Sig Dispense Refill  ? atorvastatin (LIPITOR) 20 MG tablet Take 1 tablet (20 mg total) by mouth daily. 90 tablet 1  ? cetirizine (ZYRTEC) 10 MG tablet Take 10 mg by mouth daily.    ? hydrochlorothiazide (HYDRODIURIL) 25 MG tablet Take 1 tablet (25 mg total) by mouth daily. 90 tablet 1  ? potassium chloride SA (KLOR-CON M20) 20 MEQ tablet Take 1 tablet (20 mEq total) by mouth daily. 90 tablet 1  ? HYDROcodone-acetaminophen (NORCO) 10-325 MG tablet Dispensed #20 on 02/25/19 with no refills (Patient not taking: Reported on 07/21/2021)    ? ?No facility-administered medications prior to visit.  ? ? ?No Known Allergies ? ?ROS ?As per HPI ? ?PE: ?Vitals with BMI 07/21/2021 07/07/2021 07/30/2020  ?Height 4' 11"  4' 11"  4' 11"   ?Weight 157 lbs 10 oz 153 lbs 10 oz 154 lbs 6 oz  ?BMI 31.81 31.01 31.17  ?Systolic [id number] ?Diastolic 81 95 72  ?Pulse 99  108 105  ? ? ? ?Physical Exam ? ?General: Alert and well-appearing. ?Cardiovascular: RRR, no M/R/G. ? ?LABS:  ?Last CBC ?Lab Results  ?Component Value Date  ? WBC 7.4 07/30/2020  ? HGB 14.9 07/30/2020  ? HCT 41.8 07/30/2020  ? MCV 89.1 07/30/2020  ? MCH 31.8 07/30/2020  ? RDW 12.8 07/30/2020  ? PLT 333 07/30/2020  ? ?Last metabolic panel ?Lab Results  ?Component Value Date  ? GLUCOSE 107 (H) 07/07/2021  ? NA 138 07/07/2021  ? K 3.0 (L) 07/07/2021  ? CL 98 07/07/2021  ? CO2 34 (H) 07/07/2021  ? BUN 15 07/07/2021  ? CREATININE 1.03 07/07/2021  ? CALCIUM 9.7 07/07/2021  ? PROT 7.5 07/07/2021  ? ALBUMIN 4.4 07/07/2021  ? BILITOT 0.7 07/07/2021  ? ALKPHOS 103 07/07/2021  ? AST 38 (H) 07/07/2021  ? ALT 73 (H) 07/07/2021  ? ?Last  lipids ?Lab Results  ?Component Value Date  ? CHOL 158 07/30/2020  ? HDL 56 07/30/2020  ? [place] 78 07/30/2020  ? LDLDIRECT 144.0 03/04/2019  ? TRIG 147 07/30/2020  ? CHOLHDL 2.8 07/30/2020  ? ?Last hemoglobin A1c ?Lab Results  ?Component Value Date  ? HGBA1C 5.4 07/30/2020  ? ?IMPRESSION AND PLAN: ? ?#1 hypertension, well controlled on HCTZ 25 mg a day. ?Potassium slightly low so she has been on 40 mEq daily for about the last 10 days or so. ?Repeating electrolytes and creatinine today. ? ?#2 NASH. ?Transaminases slightly improved last month. ?Encouraged weight loss. ?Plan repeat liver ultrasound 1 year. ? ?#3 hyperlipidemia. ?On atorvastatin 20 mg a day. ?Last LDL was 78 about 1 year ago. ?She ate a tuna fish sandwich about 4 hours ago. ?Lipid panel drawn today. ? ?#4 preventative health care. ?Reminded patient of need for screening mammogram--she states she will schedule this herself. ?Shingrix No. 2 given today. ?Next colonoscopy due 2025. ? ?An After Visit Summary was printed and given to the patient. ? ?FOLLOW UP: Return in about 6 months (around 01/21/2022) for routine chronic illness f/u. ? ?Signed:  Crissie Sickles, MD           07/21/2021 ? ?

## 2021-07-22 LAB — BASIC METABOLIC PANEL
BUN: 18 mg/dL (ref 6–23)
CO2: 31 mEq/L (ref 19–32)
Calcium: 10 mg/dL (ref 8.4–10.5)
Chloride: 99 mEq/L (ref 96–112)
Creatinine, Ser: 1.12 mg/dL (ref 0.40–1.20)
GFR: 53.07 mL/min — ABNORMAL LOW (ref >60.00)
Glucose, Bld: 107 mg/dL — ABNORMAL HIGH (ref 70–99)
Potassium: 3.4 mEq/L — ABNORMAL LOW (ref 3.5–5.1)
Sodium: 139 mEq/L (ref 135–145)

## 2021-07-22 LAB — LIPID PANEL
Cholesterol: 157 mg/dL (ref 0–200)
HDL: 62.7 mg/dL (ref >39.00)
LDL Cholesterol: 70 mg/dL (ref 0–99)
NonHDL: 94.11
Total CHOL/HDL Ratio: 3
Triglycerides: 123 mg/dL (ref 0.0–149.0)
VLDL: 24.6 mg/dL (ref 0.0–40.0)

## 2021-09-29 ENCOUNTER — Other Ambulatory Visit (HOSPITAL_BASED_OUTPATIENT_CLINIC_OR_DEPARTMENT_OTHER): Payer: Self-pay | Admitting: Family Medicine

## 2021-09-29 DIAGNOSIS — Z1231 Encounter for screening mammogram for malignant neoplasm of breast: Secondary | ICD-10-CM

## 2021-09-29 DIAGNOSIS — Z0289 Encounter for other administrative examinations: Secondary | ICD-10-CM

## 2021-09-30 ENCOUNTER — Ambulatory Visit (HOSPITAL_BASED_OUTPATIENT_CLINIC_OR_DEPARTMENT_OTHER)
Admission: RE | Admit: 2021-09-30 | Discharge: 2021-09-30 | Disposition: A | Discharge status: Home / Self Care | Payer: BLUE CROSS/BLUE SHIELD | Source: Ambulatory Visit | Attending: Family Medicine | Admitting: Family Medicine

## 2021-09-30 DIAGNOSIS — Z1231 Encounter for screening mammogram for malignant neoplasm of breast: Secondary | ICD-10-CM | POA: Diagnosis present

## 2021-09-30 DIAGNOSIS — Z0289 Encounter for other administrative examinations: Secondary | ICD-10-CM | POA: Diagnosis not present

## 2022-01-01 ENCOUNTER — Other Ambulatory Visit: Payer: Self-pay | Admitting: Family Medicine

## 2022-01-31 ENCOUNTER — Other Ambulatory Visit: Payer: Self-pay | Admitting: Family Medicine

## 2022-01-31 NOTE — Telephone Encounter (Signed)
Requesting: klor-con Last Visit: 07/21/21 Next Visit: 6 mo f/u not completed Last Refill: 01/02/22(30,0)

## 2022-02-06 ENCOUNTER — Other Ambulatory Visit: Payer: Self-pay | Admitting: Family Medicine

## 2022-02-09 ENCOUNTER — Ambulatory Visit: Payer: BLUE CROSS/BLUE SHIELD | Admitting: Family Medicine

## 2022-02-09 ENCOUNTER — Encounter: Payer: Self-pay | Admitting: Family Medicine

## 2022-02-09 VITALS — BP 136/80 | HR 88 | Temp 98.4°F | Ht 59.0 in | Wt 158.2 lb

## 2022-02-09 DIAGNOSIS — E7800 Pure hypercholesterolemia, unspecified: Secondary | ICD-10-CM

## 2022-02-09 DIAGNOSIS — R7301 Impaired fasting glucose: Secondary | ICD-10-CM

## 2022-02-09 DIAGNOSIS — K7581 Nonalcoholic steatohepatitis (NASH): Secondary | ICD-10-CM

## 2022-02-09 DIAGNOSIS — Z0000 Encounter for general adult medical examination without abnormal findings: Secondary | ICD-10-CM | POA: Diagnosis not present

## 2022-02-09 DIAGNOSIS — I10 Essential (primary) hypertension: Secondary | ICD-10-CM

## 2022-02-09 LAB — COMPREHENSIVE METABOLIC PANEL
ALT: 97 U/L — ABNORMAL HIGH (ref 0–35)
AST: 48 U/L — ABNORMAL HIGH (ref 0–37)
Albumin: 4.6 g/dL (ref 3.5–5.2)
Alkaline Phosphatase: 99 U/L (ref 39–117)
BUN: 13 mg/dL (ref 6–23)
CO2: 30 mEq/L (ref 19–32)
Calcium: 10.1 mg/dL (ref 8.4–10.5)
Chloride: 99 mEq/L (ref 96–112)
Creatinine, Ser: 1.04 mg/dL (ref 0.40–1.20)
GFR: 57.79 mL/min — ABNORMAL LOW (ref >60.00)
Glucose, Bld: 95 mg/dL (ref 70–99)
Potassium: 4 mEq/L (ref 3.5–5.1)
Sodium: 139 mEq/L (ref 135–145)
Total Bilirubin: 0.6 mg/dL (ref 0.2–1.2)
Total Protein: 7.9 g/dL (ref 6.0–8.3)

## 2022-02-09 LAB — LIPID PANEL
Cholesterol: 168 mg/dL (ref 0–200)
HDL: 51.7 mg/dL (ref >39.00)
LDL Cholesterol: 78 mg/dL (ref 0–99)
NonHDL: 116.58
Total CHOL/HDL Ratio: 3
Triglycerides: 194 mg/dL — ABNORMAL HIGH (ref 0.0–149.0)
VLDL: 38.8 mg/dL (ref 0.0–40.0)

## 2022-02-09 LAB — CBC
HCT: 41.3 % (ref 36.0–46.0)
Hemoglobin: 14.2 g/dL (ref 12.0–15.0)
MCHC: 34.3 g/dL (ref 30.0–36.0)
MCV: 90 fl (ref 78.0–100.0)
Platelets: 301 10*3/uL (ref 150.0–400.0)
RBC: 4.59 Mil/uL (ref 3.87–5.11)
RDW: 13 % (ref 11.5–15.5)
WBC: 6 10*3/uL (ref 4.0–10.5)

## 2022-02-09 LAB — HEMOGLOBIN A1C: Hgb A1c MFr Bld: 5.9 % (ref 4.6–6.5)

## 2022-02-09 MED ORDER — HYDROCHLOROTHIAZIDE 25 MG PO TABS: 25.0000 mg | ORAL_TABLET | Freq: Every day | ORAL | 1 refills | Status: DC

## 2022-02-09 MED ORDER — ATORVASTATIN CALCIUM 20 MG PO TABS: 20.0000 mg | ORAL_TABLET | Freq: Every day | ORAL | 1 refills | Status: DC

## 2022-02-09 MED ORDER — POTASSIUM CHLORIDE CRYS ER 20 MEQ PO TBCR: 20.0000 meq | EXTENDED_RELEASE_TABLET | Freq: Every day | ORAL | 1 refills | Status: DC

## 2022-02-09 NOTE — Patient Instructions (Addendum)
Please contact Dr.Jertson to schedule your next visit.  Their office number is [phone]  Health Maintenance, Female Adopting a healthy lifestyle and getting preventive care are important in promoting health and wellness. Ask your health care provider about: The right schedule for you to have regular tests and exams. Things you can do on your own to prevent diseases and keep yourself healthy. What should I know about diet, weight, and exercise? Eat a healthy diet  Eat a diet that includes plenty of vegetables, fruits, low-fat dairy products, and lean protein. Do not eat a lot of foods that are high in solid fats, added sugars, or sodium. Maintain a healthy weight Body mass index (BMI) is used to identify weight problems. It estimates body fat based on height and weight. Your health care provider can help determine your BMI and help you achieve or maintain a healthy weight. Get regular exercise Get regular exercise. This is one of the most important things you can do for your health. Most adults should: Exercise for at least 150 minutes each week. The exercise should increase your heart rate and make you sweat (moderate-intensity exercise). Do strengthening exercises at least twice a week. This is in addition to the moderate-intensity exercise. Spend less time sitting. Even light physical activity can be beneficial. Watch cholesterol and blood lipids Have your blood tested for lipids and cholesterol at 62 years of age, then have this test every 5 years. Have your cholesterol levels checked more often if: Your lipid or cholesterol levels are high. You are older than 62 years of age. You are at high risk for heart disease. What should I know about cancer screening? Depending on your health history and family history, you may need to have cancer screening at various ages. This may include screening for: Breast cancer. Cervical cancer. Colorectal cancer. Skin cancer. Lung cancer. What  should I know about heart disease, diabetes, and high blood pressure? Blood pressure and heart disease High blood pressure causes heart disease and increases the risk of stroke. This is more likely to develop in people who have high blood pressure readings or are overweight. Have your blood pressure checked: Every 3-5 years if you are 62-62 years of age. Every year if you are 1 years old or older. Diabetes Have regular diabetes screenings. This checks your fasting blood sugar level. Have the screening done: Once every three years after age 60 if you are at a normal weight and have a low risk for diabetes. More often and at a younger age if you are overweight or have a high risk for diabetes. What should I know about preventing infection? Hepatitis B If you have a higher risk for hepatitis B, you should be screened for this virus. Talk with your health care provider to find out if you are at risk for hepatitis B infection. Hepatitis C Testing is recommended for: Everyone born from [birth year] through 1965. Anyone with known risk factors for hepatitis C. Sexually transmitted infections (STIs) Get screened for STIs, including gonorrhea and chlamydia, if: You are sexually active and are younger than 62 years of age. You are older than 62 years of age and your health care provider tells you that you are at risk for this type of infection. Your sexual activity has changed since you were last screened, and you are at increased risk for chlamydia or gonorrhea. Ask your health care provider if you are at risk. Ask your health care provider about whether you are at high risk  for HIV. Your health care provider may recommend a prescription medicine to help prevent HIV infection. If you choose to take medicine to prevent HIV, you should first get tested for HIV. You should then be tested every 3 months for as long as you are taking the medicine. Pregnancy If you are about to stop having your period  (premenopausal) and you may become pregnant, seek counseling before you get pregnant. Take 400 to 800 micrograms (mcg) of folic acid every day if you become pregnant. Ask for birth control (contraception) if you want to prevent pregnancy. Osteoporosis and menopause Osteoporosis is a disease in which the bones lose minerals and strength with aging. This can result in bone fractures. If you are 62 years old or older, or if you are at risk for osteoporosis and fractures, ask your health care provider if you should: Be screened for bone loss. Take a calcium or vitamin D supplement to lower your risk of fractures. Be given hormone replacement therapy (HRT) to treat symptoms of menopause. Follow these instructions at home: Alcohol use Do not drink alcohol if: Your health care provider tells you not to drink. You are pregnant, may be pregnant, or are planning to become pregnant. If you drink alcohol: Limit how much you have to: 0-1 drink a day. Know how much alcohol is in your drink. In the [place] one drink equals one 12 oz bottle of beer (355 mL), one 5 oz glass of wine (148 mL), or one 1 oz glass of hard liquor (44 mL). Lifestyle Do not use any products that contain nicotine or tobacco. These products include cigarettes, chewing tobacco, and vaping devices, such as e-cigarettes. If you need help quitting, ask your health care provider. Do not use street drugs. Do not share needles. Ask your health care provider for help if you need support or information about quitting drugs. General instructions Schedule regular health, dental, and eye exams. Stay current with your vaccines. Tell your health care provider if: You often feel depressed. You have ever been abused or do not feel safe at home. Summary Adopting a healthy lifestyle and getting preventive care are important in promoting health and wellness. Follow your health care provider's instructions about healthy diet, exercising, and getting  tested or screened for diseases. Follow your health care provider's instructions on monitoring your cholesterol and blood pressure. This information is not intended to replace advice given to you by your health care provider. Make sure you discuss any questions you have with your health care provider. Document Revised: 09/20/2020 Document Reviewed: 09/20/2020 Elsevier Patient Education  [employer].

## 2022-02-09 NOTE — Progress Notes (Signed)
Office Note 02/09/2022  CC:  Chief Complaint  Patient presents with   Hypertension   Hyperlipidemia    Pt is not fasting (slim-fast shake)    Patient is a 61 y.o. female who is here for annual health maintenance exam and 69-monthfollow-up hypertension, hyperlipidemia, and chronic renal insufficiency stage II/III. A/P as of last visit: "#1 hypertension, well controlled on HCTZ 25 mg a day. Potassium slightly low so she has been on 40 mEq daily for about the last 10 days or so. Repeating electrolytes and creatinine today.   #2 NASH. Transaminases slightly improved last month. Encouraged weight loss. Plan repeat liver ultrasound 1 year.   #3 hyperlipidemia. On atorvastatin 20 mg a day. Last LDL was 78 about 1 year ago. She ate a tuna fish sandwich about 4 hours ago. Lipid panel drawn today.   #4 preventative health care. Reminded patient of need for screening mammogram--she states she will schedule this herself. Shingrix No. 2 given today. Next colonoscopy due 2025."  INTERIM HX: Belinda Cox feels well. Home bp's normal. She has had a pretty uneventful summer other than going shopping with her daughter.  Past Medical History:  Diagnosis Date   Asymptomatic cholelithiasis 07/2020   Chronic renal insufficiency, stage II (mild)    GFR 60s   Elevated transaminase level    Viral hep serologies neg. Fatty liver on u/s 07/2020.   Fatty liver 07/2020   u/s documented   History of abnormal Pap smear    AGUS; ASCUS, with high risk HPV+, (colpo neg 09/2012--repeat pap 12/16/15 nl, no HR HPV--per GYN needs pap and HR HPV testing again in 3 yrs))   History of adenomatous polyp of colon 2015, 2020   Recall 02/2024   History of colitis    focal area of chronic active colitis on colonoscopy 02/2019   Hyperlipemia, mixed 02/2019   Great response to atorva    Hypertension    IFG (impaired fasting glucose) 07/2016   HbA1c 5.5%   Osteoarthritis    fingers   Seasonal allergic rhinitis  09/10/2012    Past Surgical History:  Procedure Laterality Date   COLONOSCOPY W/ POLYPECTOMY  08/2013; 02/19/2019   02/2019 focal colitis (cecum).->path showed chronic active colitis.  Adenoma x 1. Recall 582yr COLPOSCOPY     POLYPECTOMY     Unremarkable      Family History  Problem Relation Age of Onset   Alcoholism Maternal Grandfather    Alcoholism Brother    Arthritis Father        Alive   Hypertension Father    Heart disease Maternal Grandmother    Diabetes Maternal Grandmother    Cancer - Other Maternal Grandmother        Intestinal   Pancreatic cancer Maternal Grandmother    Hypertension Mother        Alive   Diabetes Mother    Cancer Mother    Uterine cancer Mother    Diabetes Paternal Grandmother    Leukemia Sister    Colon cancer Neg Hx    Rectal cancer Neg Hx    Stomach cancer Neg Hx    Colon polyps Neg Hx    Esophageal cancer Neg Hx     Social History   Socioeconomic History   Marital status: Married    Spouse name: Not on file   Number of children: Not on file   Years of education: Not on file   Highest education level: Not on file  Occupational History  Not on file  Tobacco Use   Smoking status: Never   Smokeless tobacco: Never  Vaping Use   Vaping Use: Never used  Substance and Sexual Activity   Alcohol use: No   Drug use: No   Sexual activity: Yes    Partners: Male    Birth control/protection: None, Post-menopausal  Other Topics Concern   Not on file  Social History Narrative   Married, has 2 grown children.   Orig from [place], [place].   Lives in [place], [place].   Occupation: early childhood ed Pharmacist, hospital.   No Tob/alc/drugs.   Social Determinants of Health   Financial Resource Strain: Not on file  Food Insecurity: Not on file  Transportation Needs: Not on file  Physical Activity: Not on file  Stress: Not on file  Social Connections: Not on file  Intimate Partner Violence: Not on file    Outpatient Medications Prior to Visit   Medication Sig Dispense Refill   cetirizine (ZYRTEC) 10 MG tablet Take 10 mg by mouth daily.     POTASSIUM CHLORIDE PO Take by mouth daily.     atorvastatin (LIPITOR) 20 MG tablet TAKE 1 TABLET BY MOUTH EVERY DAY 30 tablet 0   hydrochlorothiazide (HYDRODIURIL) 25 MG tablet TAKE 1 TABLET (25 MG TOTAL) BY MOUTH DAILY. 30 tablet 0   KLOR-CON M20 20 MEQ tablet TAKE 1 TABLET BY MOUTH EVERY DAY (Patient not taking: Reported on 02/09/2022) 30 tablet 0   No facility-administered medications prior to visit.    No Known Allergies  ROS Review of Systems  Constitutional:  Negative for appetite change, chills, fatigue and fever.  HENT:  Negative for congestion, dental problem, ear pain and sore throat.   Eyes:  Negative for discharge, redness and visual disturbance.  Respiratory:  Negative for cough, chest tightness, shortness of breath and wheezing.   Cardiovascular:  Negative for chest pain, palpitations and leg swelling.  Gastrointestinal:  Negative for abdominal pain, blood in stool, diarrhea, nausea and vomiting.  Genitourinary:  Negative for difficulty urinating, dysuria, flank pain, frequency, hematuria and urgency.  Musculoskeletal:  Negative for arthralgias, back pain, joint swelling, myalgias and neck stiffness.  Skin:  Negative for pallor and rash.  Neurological:  Negative for dizziness, speech difficulty, weakness and headaches.  Hematological:  Negative for adenopathy. Does not bruise/bleed easily.  Psychiatric/Behavioral:  Negative for confusion and sleep disturbance. The patient is not nervous/anxious.    PE;    02/09/2022    1:03 PM 07/21/2021    3:47 PM 07/07/2021    3:29 PM  Vitals with BMI  Height 4' 11"  4' 11"  4' 11"   Weight 158 lbs 3 oz 157 lbs 10 oz 153 lbs 10 oz  BMI 31.94 84.13 24.40  Systolic 102 725 366  Diastolic 80 81 95  Pulse 88 99 108  Exam chaperoned by Deveron Furlong, CMA.  Gen: Alert, well appearing.  Patient is oriented to person, place, time, and  situation. AFFECT: pleasant, lucid thought and speech. ENT: Ears: EACs clear, normal epithelium.  TMs with good light reflex and landmarks bilaterally.  Eyes: no injection, icteris, swelling, or exudate.  EOMI, PERRLA. Nose: no drainage or turbinate edema/swelling.  No injection or focal lesion.  Mouth: lips without lesion/swelling.  Oral mucosa pink and moist.  Dentition intact and without obvious caries or gingival swelling.  Oropharynx without erythema, exudate, or swelling.  Neck: supple/nontender.  No LAD, mass, or TM.  Carotid pulses 2+ bilaterally, without bruits. CV: RRR, no  m/r/g.   LUNGS: CTA bilat, nonlabored resps, good aeration in all lung fields. ABD: soft, NT, ND, BS normal.  No hepatospenomegaly or mass.  No bruits. EXT: no clubbing, cyanosis, or edema.  Musculoskeletal: no joint swelling, erythema, warmth, or tenderness.  ROM of all joints intact. Skin - no sores or suspicious lesions or rashes or color changes  Pertinent labs:  Lab Results  Component Value Date   TSH 1.82 07/30/2020   Lab Results  Component Value Date   WBC 7.4 07/30/2020   HGB 14.9 07/30/2020   HCT 41.8 07/30/2020   MCV 89.1 07/30/2020   PLT 333 07/30/2020   Lab Results  Component Value Date   CREATININE 1.12 07/21/2021   BUN 18 07/21/2021   NA 139 07/21/2021   K 3.4 (L) 07/21/2021   CL 99 07/21/2021   CO2 31 07/21/2021   Lab Results  Component Value Date   ALT 73 (H) 07/07/2021   AST 38 (H) 07/07/2021   ALKPHOS 103 07/07/2021   BILITOT 0.7 07/07/2021   Lab Results  Component Value Date   CHOL 157 07/21/2021   Lab Results  Component Value Date   HDL 62.70 07/21/2021   Lab Results  Component Value Date   LDLCALC 70 07/21/2021   Lab Results  Component Value Date   TRIG 123.0 07/21/2021   Lab Results  Component Value Date   CHOLHDL 3 07/21/2021   Lab Results  Component Value Date   HGBA1C 5.4 07/30/2020   ASSESSMENT AND PLAN:   Health maintenance exam: Reviewed age  and gender appropriate health maintenance issues (prudent diet, regular exercise, health risks of tobacco and excessive alcohol, use of seatbelts, fire alarms in home, use of sunscreen).  Also reviewed age and gender appropriate health screening as well as vaccine recommendations. Vaccines: Flu->declined.   Labs: cbc, cmet, flp, tsh, a1c. Cervical ca screening: pap via GYN in the past.  Patient estimates that its been about 5 years since she has been.  Pt to schedule routine f/u.  Patient recalls one abnormal Pap in the past, approximately 10 years ago, states that follow-up testing showed resolved. Breast ca screening: mammogram normal 09/2021.  Repeat 1 year. Colon ca screening: Recall 02/2024  Pretension, well controlled on HCTZ 25 mg a day. Continue 20 mill equivalent daily potassium supplement. Electrolytes and creatinine today.  Hyperlipidemia: Doing well on atorvastatin 20 mg a day. Lipid panel and hepatic panel today (she had a slimfast shake about 6 hrs ago).  An After Visit Summary was printed and given to the patient.  FOLLOW UP:  Return in about 6 months (around 08/10/2022) for routine chronic illness f/u.  Signed:  Crissie Sickles, MD           02/09/2022

## 2022-04-17 ENCOUNTER — Telehealth: Payer: Self-pay

## 2022-04-17 NOTE — Telephone Encounter (Signed)
Patient has had loose stools since Thanksgiving.  Requesting to speak to nurse.  Please call [phone]

## 2022-04-17 NOTE — Telephone Encounter (Signed)
Patient states it is not constant. She used Pepto bismol 2 days last week and stopped because she thought it was improving. She has resumed taking as of this morning. Denies nausea, vomiting or abdominal pain. She was scheduled with provider for this week to further discuss.

## 2022-04-19 ENCOUNTER — Ambulatory Visit: Payer: BLUE CROSS/BLUE SHIELD | Admitting: Medical

## 2022-04-19 ENCOUNTER — Ambulatory Visit: Payer: BLUE CROSS/BLUE SHIELD | Admitting: Family Medicine

## 2022-05-29 ENCOUNTER — Ambulatory Visit: Payer: BLUE CROSS/BLUE SHIELD | Admitting: Family Medicine

## 2022-08-10 ENCOUNTER — Encounter: Payer: Self-pay | Admitting: Family Medicine

## 2022-08-10 ENCOUNTER — Ambulatory Visit: Payer: BLUE CROSS/BLUE SHIELD | Admitting: Family Medicine

## 2022-08-10 VITALS — BP 136/85 | HR 116 | Temp 99.1°F | Ht 59.0 in | Wt 154.0 lb

## 2022-08-10 DIAGNOSIS — K7581 Nonalcoholic steatohepatitis (NASH): Secondary | ICD-10-CM

## 2022-08-10 DIAGNOSIS — I10 Essential (primary) hypertension: Secondary | ICD-10-CM

## 2022-08-10 DIAGNOSIS — R7303 Prediabetes: Secondary | ICD-10-CM | POA: Diagnosis not present

## 2022-08-10 DIAGNOSIS — E7800 Pure hypercholesterolemia, unspecified: Secondary | ICD-10-CM

## 2022-08-10 LAB — COMPREHENSIVE METABOLIC PANEL
ALT: 106 U/L — ABNORMAL HIGH (ref 0–35)
AST: 61 U/L — ABNORMAL HIGH (ref 0–37)
Albumin: 4.5 g/dL (ref 3.5–5.2)
Alkaline Phosphatase: 112 U/L (ref 39–117)
BUN: 13 mg/dL (ref 6–23)
CO2: 32 mEq/L (ref 19–32)
Calcium: 9.8 mg/dL (ref 8.4–10.5)
Chloride: 97 mEq/L (ref 96–112)
Creatinine, Ser: 1.08 mg/dL (ref 0.40–1.20)
GFR: 55.03 mL/min — ABNORMAL LOW (ref >60.00)
Glucose, Bld: 113 mg/dL — ABNORMAL HIGH (ref 70–99)
Potassium: 3.5 mEq/L (ref 3.5–5.1)
Sodium: 137 mEq/L (ref 135–145)
Total Bilirubin: 0.6 mg/dL (ref 0.2–1.2)
Total Protein: 7.7 g/dL (ref 6.0–8.3)

## 2022-08-10 LAB — HEMOGLOBIN A1C: Hgb A1c MFr Bld: 5.9 % (ref 4.6–6.5)

## 2022-08-10 MED ORDER — HYDROCHLOROTHIAZIDE 25 MG PO TABS: 25.0000 mg | ORAL_TABLET | Freq: Every day | ORAL | 1 refills | Status: DC

## 2022-08-10 MED ORDER — POTASSIUM CHLORIDE CRYS ER 20 MEQ PO TBCR: 20.0000 meq | EXTENDED_RELEASE_TABLET | Freq: Every day | ORAL | 1 refills | Status: DC

## 2022-08-10 MED ORDER — ATORVASTATIN CALCIUM 20 MG PO TABS: 20.0000 mg | ORAL_TABLET | Freq: Every day | ORAL | 1 refills | Status: DC

## 2022-08-10 NOTE — Progress Notes (Signed)
OFFICE VISIT  08/10/2022  CC:  Chief Complaint  Patient presents with   Medical Management of Chronic Issues    Pt is not fasting    Patient is a 62 y.o. female who presents for 50-month follow-up hypertension, hypercholesterolemia, IFG, NASH. A/P as of last visit: 1 Hypertension, well controlled on HCTZ 25 mg a day. Continue 20 mill equivalent daily potassium supplement. Electrolytes and creatinine today.   2. Hyperlipidemia: Doing well on atorvastatin 20 mg a day. Lipid panel and hepatic panel today (she had a slimfast shake about 6 hrs ago).  INTERIM HX: Belinda Cox is feeling well. She takes care of her grandkids a lot.  Home blood pressures consistently 130/80.  ROS --> no fevers, no CP, no SOB, no wheezing, no cough, no dizziness, no HAs, no rashes, no melena/hematochezia.  No polyuria or polydipsia.  No myalgias or arthralgias.  No focal weakness, paresthesias, or tremors.  No acute vision or hearing abnormalities.  No dysuria or unusual/new urinary urgency or frequency.  No recent changes in lower legs. No n/v/d or abd pain.  No palpitations.     Past Medical History:  Diagnosis Date   Asymptomatic cholelithiasis 07/2020   Chronic renal insufficiency, stage II (mild)    GFR 60s   Elevated transaminase level    Viral hep serologies neg. Fatty liver on u/s 07/2020.   Fatty liver 07/2020   u/s documented   History of abnormal Pap smear    AGUS; ASCUS, with high risk HPV+, (colpo neg 09/2012--repeat pap 12/16/15 nl, no HR HPV--per GYN needs pap and HR HPV testing again in 3 yrs))   History of adenomatous polyp of colon 2015, 2020   Recall 02/2024   History of colitis    focal area of chronic active colitis on colonoscopy 02/2019   Hyperlipemia, mixed 02/2019   Great response to atorva    Hypertension    IFG (impaired fasting glucose) 07/2016   HbA1c 5.5%   Osteoarthritis    fingers   Seasonal allergic rhinitis 09/10/2012    Past Surgical History:  Procedure Laterality  Date   COLONOSCOPY W/ POLYPECTOMY  08/2013; 02/19/2019   02/2019 focal colitis (cecum).->path showed chronic active colitis.  Adenoma x 1. Recall 27yr   COLPOSCOPY     POLYPECTOMY     Unremarkable      Outpatient Medications Prior to Visit  Medication Sig Dispense Refill   atorvastatin (LIPITOR) 20 MG tablet Take 1 tablet (20 mg total) by mouth daily. 90 tablet 1   cetirizine (ZYRTEC) 10 MG tablet Take 10 mg by mouth daily.     hydrochlorothiazide (HYDRODIURIL) 25 MG tablet Take 1 tablet (25 mg total) by mouth daily. 90 tablet 1   POTASSIUM CHLORIDE PO Take by mouth daily.     potassium chloride SA (KLOR-CON M20) 20 MEQ tablet Take 1 tablet (20 mEq total) by mouth daily. (Patient not taking: Reported on 08/10/2022) 90 tablet 1   No facility-administered medications prior to visit.    No Known Allergies  Review of Systems As per HPI  PE:    08/10/2022   12:52 PM 02/09/2022    1:03 PM 07/21/2021    3:47 PM  Vitals with BMI  Height 4\' 11"  4\' 11"  4\' 11"   Weight 154 lbs 158 lbs 3 oz 157 lbs 10 oz  BMI 31.09 [id number]  Systolic [id number]  Diastolic 85 80 81  Pulse [id number] 88 99     Physical Exam  Gen: Alert,  well appearing.  Patient is oriented to person, place, time, and situation. AFFECT: pleasant, lucid thought and speech. CV: RRR, no m/r/g.   LUNGS: CTA bilat, nonlabored resps, good aeration in all lung fields. EXT: no clubbing or cyanosis.  no edema.    LABS:  Last CBC Lab Results  Component Value Date   WBC 6.0 02/09/2022   HGB 14.2 02/09/2022   HCT 41.3 02/09/2022   MCV 90.0 02/09/2022   MCH 31.8 07/30/2020   RDW 13.0 02/09/2022   PLT 301.0 123456   Last metabolic panel Lab Results  Component Value Date   GLUCOSE 95 02/09/2022   NA 139 02/09/2022   K 4.0 02/09/2022   CL 99 02/09/2022   CO2 30 02/09/2022   BUN 13 02/09/2022   CREATININE 1.04 02/09/2022   CALCIUM 10.1 02/09/2022   PROT 7.9 02/09/2022   ALBUMIN 4.6 02/09/2022   BILITOT 0.6  02/09/2022   ALKPHOS 99 02/09/2022   AST 48 (H) 02/09/2022   ALT 97 (H) 02/09/2022   Last lipids Lab Results  Component Value Date   CHOL 168 02/09/2022   HDL 51.70 02/09/2022   LDLCALC 78 02/09/2022   LDLDIRECT 144.0 03/04/2019   TRIG 194.0 (H) 02/09/2022   CHOLHDL 3 02/09/2022   Last hemoglobin A1c Lab Results  Component Value Date   HGBA1C 5.9 02/09/2022   Last thyroid functions Lab Results  Component Value Date   TSH 1.82 07/30/2020   T3TOTAL 140.0 07/28/2016   IMPRESSION AND PLAN:  #1 hypertension, well-controlled on HCTZ 25 mg a day. She will continue this as well as potassium supplement 20 mill equivalents daily. Electrolytes and creatinine today.  2.  Hypercholesterolemia, doing well on a atorvastatin 20 mg a day. She is not fasting today. Last LDL about 6 months ago was 78. Recheck lipid panel in 6 months. Hepatic panel today.  3.  NASH. Hepatic panel today.  4.  Prediabetes. Random glucose and hemoglobin A1c today.  An After Visit Summary was printed and given to the patient.  FOLLOW UP: No follow-ups on file.  Signed:  Crissie Sickles, MD           08/10/2022

## 2022-12-28 ENCOUNTER — Encounter (INDEPENDENT_AMBULATORY_CARE_PROVIDER_SITE_OTHER): Payer: Self-pay

## 2023-02-08 NOTE — Patient Instructions (Addendum)
It was very nice to see you today!   Call Dr. Nira Conn office to set up pelvic/pap.   PLEASE NOTE:  If labs were collected or images ordered, we will inform you of  results once we have received them and reviewed. We will contact you either by echart message, or telephone call.     Please give ample time to the testing facility, and our office to run, receive and review results. Please do not call inquiring of results, even if you can see them in your chart. We will contact you as soon as we are able. If it has been over 1 week since the test was completed, and you have not yet heard from [place], then please call us.   If we ordered any referrals today, please let us know if you have not heard from their office within the next 2 weeks. You should receive a letter via MyChart confirming if the referral was approved and their office contact information to schedule.   Please try these tips to maintain a healthy lifestyle:  Eat most of your calories during the day when you are active. Eliminate processed foods including packaged sweets (pies, cakes, cookies), reduce intake of potatoes, white bread, white pasta, and white rice. Look for whole grain options, oat flour or almond flour.  Each meal should contain half fruits/vegetables, one quarter protein, and one quarter carbs (no bigger than a computer mouse).  Cut down on sweet beverages. This includes juice, soda, and sweet tea. Also watch fruit intake, though this is a healthier sweet option, it still contains natural sugar! Limit to 3 servings daily.  Drink at least 1 glass of water with each meal and aim for at least 8 glasses per day  Exercise at least 150 minutes every week.

## 2023-02-12 ENCOUNTER — Encounter: Payer: Self-pay | Admitting: Family Medicine

## 2023-02-12 ENCOUNTER — Ambulatory Visit (INDEPENDENT_AMBULATORY_CARE_PROVIDER_SITE_OTHER): Payer: BLUE CROSS/BLUE SHIELD | Admitting: Family Medicine

## 2023-02-12 VITALS — BP 140/88 | HR 110 | Temp 98.6°F | Ht 61.0 in | Wt 163.4 lb

## 2023-02-12 DIAGNOSIS — Z0000 Encounter for general adult medical examination without abnormal findings: Secondary | ICD-10-CM

## 2023-02-12 DIAGNOSIS — R7303 Prediabetes: Secondary | ICD-10-CM | POA: Diagnosis not present

## 2023-02-12 DIAGNOSIS — H538 Other visual disturbances: Secondary | ICD-10-CM

## 2023-02-12 DIAGNOSIS — R195 Other fecal abnormalities: Secondary | ICD-10-CM

## 2023-02-12 DIAGNOSIS — I10 Essential (primary) hypertension: Secondary | ICD-10-CM

## 2023-02-12 DIAGNOSIS — K219 Gastro-esophageal reflux disease without esophagitis: Secondary | ICD-10-CM

## 2023-02-12 DIAGNOSIS — E7800 Pure hypercholesterolemia, unspecified: Secondary | ICD-10-CM

## 2023-02-12 DIAGNOSIS — Z1231 Encounter for screening mammogram for malignant neoplasm of breast: Secondary | ICD-10-CM

## 2023-02-12 LAB — COMPREHENSIVE METABOLIC PANEL
ALT: 108 U/L — ABNORMAL HIGH (ref 0–35)
AST: 53 U/L — ABNORMAL HIGH (ref 0–37)
Albumin: 4.4 g/dL (ref 3.5–5.2)
Alkaline Phosphatase: 90 U/L (ref 39–117)
BUN: 14 mg/dL (ref 6–23)
CO2: 30 meq/L (ref 19–32)
Calcium: 9.8 mg/dL (ref 8.4–10.5)
Chloride: 101 meq/L (ref 96–112)
Creatinine, Ser: 1.03 mg/dL (ref 0.40–1.20)
GFR: 58.05 mL/min — ABNORMAL LOW (ref >60.00)
Glucose, Bld: 100 mg/dL — ABNORMAL HIGH (ref 70–99)
Potassium: 3.7 meq/L (ref 3.5–5.1)
Sodium: 143 meq/L (ref 135–145)
Total Bilirubin: 0.8 mg/dL (ref 0.2–1.2)
Total Protein: 7.5 g/dL (ref 6.0–8.3)

## 2023-02-12 LAB — CBC WITH DIFFERENTIAL/PLATELET
Basophils Absolute: 0.1 10*3/uL (ref 0.0–0.1)
Basophils Relative: 0.9 % (ref 0.0–3.0)
Eosinophils Absolute: 0.2 10*3/uL (ref 0.0–0.7)
Eosinophils Relative: 2.8 % (ref 0.0–5.0)
HCT: 43 % (ref 36.0–46.0)
Hemoglobin: 14.4 g/dL (ref 12.0–15.0)
Lymphocytes Relative: 37 % (ref 12.0–46.0)
Lymphs Abs: 2.6 10*3/uL (ref 0.7–4.0)
MCHC: 33.5 g/dL (ref 30.0–36.0)
MCV: 92 fL (ref 78.0–100.0)
Monocytes Absolute: 0.5 10*3/uL (ref 0.1–1.0)
Monocytes Relative: 7.1 % (ref 3.0–12.0)
Neutro Abs: 3.7 10*3/uL (ref 1.4–7.7)
Neutrophils Relative %: 52.2 % (ref 43.0–77.0)
Platelets: 308 10*3/uL (ref 150.0–400.0)
RBC: 4.68 Mil/uL (ref 3.87–5.11)
RDW: 13.6 % (ref 11.5–15.5)
WBC: 7 10*3/uL (ref 4.0–10.5)

## 2023-02-12 LAB — LIPID PANEL
Cholesterol: 156 mg/dL (ref 0–200)
HDL: 49.9 mg/dL (ref >39.00)
LDL Cholesterol: 69 mg/dL (ref 0–99)
NonHDL: 106.49
Total CHOL/HDL Ratio: 3
Triglycerides: 186 mg/dL — ABNORMAL HIGH (ref 0.0–149.0)
VLDL: 37.2 mg/dL (ref 0.0–40.0)

## 2023-02-12 LAB — HEMOGLOBIN A1C: Hgb A1c MFr Bld: 5.9 % (ref 4.6–6.5)

## 2023-02-12 MED ORDER — HYDROCHLOROTHIAZIDE 25 MG PO TABS: 25.0000 mg | ORAL_TABLET | Freq: Every day | ORAL | 1 refills | Status: DC

## 2023-02-12 MED ORDER — POTASSIUM CHLORIDE CRYS ER 20 MEQ PO TBCR: 20.0000 meq | EXTENDED_RELEASE_TABLET | Freq: Every day | ORAL | 1 refills | Status: DC

## 2023-02-12 MED ORDER — PANTOPRAZOLE SODIUM 40 MG PO TBEC: 40.0000 mg | DELAYED_RELEASE_TABLET | Freq: Every day | ORAL | 3 refills | Status: DC

## 2023-02-12 MED ORDER — ATORVASTATIN CALCIUM 20 MG PO TABS: 20.0000 mg | ORAL_TABLET | Freq: Every day | ORAL | 1 refills | Status: DC

## 2023-02-12 NOTE — Progress Notes (Signed)
Office Note 02/12/2023  CC:  Chief Complaint  Patient presents with   Annual Exam    Pt is fasting   Patient is a 62 y.o. female who is here for annual health maintenance exam and 59-month follow-up hypertension, hypercholesterolemia, NASH, and prediabetes. A/P as of last visit: "#1 hypertension, well-controlled on HCTZ 25 mg a day. She will continue this as well as potassium supplement 20 mill equivalents daily. Electrolytes and creatinine today.   2.  Hypercholesterolemia, doing well on a atorvastatin 20 mg a day. She is not fasting today. Last LDL about 6 months ago was 78. Recheck lipid panel in 6 months. Hepatic panel today.   3.  NASH. Hepatic panel today.   4.  Prediabetes. Random glucose and hemoglobin A1c today."  INTERIM HX: Overall she is doing well. She does states she has bad heartburn that hits her in the middle the night.  She admits she does not eat the right foods. She takes Pepcid around bedtime but it has not helped.  This been going on a few weeks now.  3 days ago she started noting very brief "lightning "in her lateral peripheral vision on the right side.  These last 1 to 2 seconds.  No headaches, no blurry vision or double vision.  No paresthesias or focal weakness.  No dizziness or vertigo.  No hearing abnormalities.  She has had loose bowel movements for the last few weeks.  Says they are formed but just very loose.  No abdominal pain, no blood in stool, no melena.  She just recently started some Metamucil.  Past Medical History:  Diagnosis Date   Asymptomatic cholelithiasis 07/2020   Chronic renal insufficiency, stage II (mild)    GFR 60s   Elevated transaminase level    Viral hep serologies neg. Fatty liver on u/s 07/2020.   Fatty liver 07/2020   u/s documented   History of abnormal Pap smear    AGUS; ASCUS, with high risk HPV+, (colpo neg 09/2012--repeat pap 12/16/15 nl, no HR HPV--per GYN needs pap and HR HPV testing again in 3 yrs))   History  of adenomatous polyp of colon 2015, 2020   Recall 02/2024   History of colitis    focal area of chronic active colitis on colonoscopy 02/2019   Hyperlipemia, mixed 02/2019   Great response to atorva    Hypertension    IFG (impaired fasting glucose) 07/2016   HbA1c 5.5%   Osteoarthritis    fingers   Seasonal allergic rhinitis 09/10/2012    Past Surgical History:  Procedure Laterality Date   COLONOSCOPY W/ POLYPECTOMY  08/2013; 02/19/2019   02/2019 focal colitis (cecum).->path showed chronic active colitis.  Adenoma x 1. Recall 18yr   COLPOSCOPY     POLYPECTOMY     Unremarkable      Family History  Problem Relation Age of Onset   Alcoholism Maternal Grandfather    Alcoholism Brother    Arthritis Father        Alive   Hypertension Father    Heart disease Maternal Grandmother    Diabetes Maternal Grandmother    Cancer - Other Maternal Grandmother        Intestinal   Pancreatic cancer Maternal Grandmother    Hypertension Mother        Alive   Diabetes Mother    Cancer Mother    Uterine cancer Mother    Diabetes Paternal Grandmother    Leukemia Sister    Colon cancer Neg Hx  Rectal cancer Neg Hx    Stomach cancer Neg Hx    Colon polyps Neg Hx    Esophageal cancer Neg Hx     Social History   Socioeconomic History   Marital status: Married    Spouse name: Not on file   Number of children: Not on file   Years of education: Not on file   Highest education level: Some college, no degree  Occupational History   Not on file  Tobacco Use   Smoking status: Never   Smokeless tobacco: Never  Vaping Use   Vaping status: Never Used  Substance and Sexual Activity   Alcohol use: No   Drug use: No   Sexual activity: Yes    Partners: Male    Birth control/protection: None, Post-menopausal  Other Topics Concern   Not on file  Social History Narrative   Married, has 2 grown children.   Orig from [place], [place].   Lives in [place], [place].   Occupation: early  childhood ed Runner, broadcasting/film/video.   No Tob/alc/drugs.   Social Determinants of Health   Financial Resource Strain: Low Risk  (08/08/2022)   Overall Financial Resource Strain (CARDIA)    Difficulty of Paying Living Expenses: Not hard at all  Food Insecurity: No Food Insecurity (08/08/2022)   Hunger Vital Sign    Worried About Running Out of Food in the Last Year: Never true    Ran Out of Food in the Last Year: Never true  Transportation Needs: No Transportation Needs (08/08/2022)   PRAPARE - Administrator, Civil Service (Medical): No    Lack of Transportation (Non-Medical): No  Physical Activity: Unknown (08/08/2022)   Exercise Vital Sign    Days of Exercise per Week: 0 days    Minutes of Exercise per Session: Not on file  Stress: No Stress Concern Present (08/08/2022)   [employer] of Occupational Health - Occupational Stress Questionnaire    Feeling of Stress : Only a little  Social Connections: Unknown (08/08/2022)   Social Connection and Isolation Panel [NHANES]    Frequency of Communication with Friends and Family: More than three times a week    Frequency of Social Gatherings with Friends and Family: More than three times a week    Attends Religious Services: Patient declined    Database administrator or Organizations: No    Attends Engineer, structural: Not on file    Marital Status: Married  Catering manager Violence: Not on file    Outpatient Medications Prior to Visit  Medication Sig Dispense Refill   cetirizine (ZYRTEC) 10 MG tablet Take 10 mg by mouth daily.     POTASSIUM CHLORIDE PO Take by mouth daily.     atorvastatin (LIPITOR) 20 MG tablet Take 1 tablet (20 mg total) by mouth daily. 90 tablet 1   hydrochlorothiazide (HYDRODIURIL) 25 MG tablet Take 1 tablet (25 mg total) by mouth daily. 90 tablet 1   potassium chloride SA (KLOR-CON M20) 20 MEQ tablet Take 1 tablet (20 mEq total) by mouth daily. (Patient not taking: Reported on 02/12/2023) 90 tablet 1    No facility-administered medications prior to visit.    No Known Allergies  Review of Systems  Constitutional:  Negative for appetite change, chills, fatigue and fever.  HENT:  Negative for congestion, dental problem, ear pain and sore throat.   Eyes:  Negative for discharge, redness and visual disturbance.  Respiratory:  Negative for cough, chest tightness, shortness of breath  and wheezing.   Cardiovascular:  Negative for chest pain, palpitations and leg swelling.  Gastrointestinal:  Positive for diarrhea. Negative for abdominal pain, blood in stool, nausea and vomiting.  Genitourinary:  Negative for difficulty urinating, dysuria, flank pain, frequency, hematuria and urgency.  Musculoskeletal:  Negative for arthralgias, back pain, joint swelling, myalgias and neck stiffness.  Skin:  Negative for pallor and rash.  Neurological:  Negative for dizziness, speech difficulty, weakness and headaches.  Hematological:  Negative for adenopathy. Does not bruise/bleed easily.  Psychiatric/Behavioral:  Negative for confusion and sleep disturbance. The patient is not nervous/anxious.     PE;    02/12/2023    8:11 AM 02/12/2023    8:03 AM 08/10/2022   12:52 PM  Vitals with BMI  Height  5\' 1"  4\' 11"   Weight  163 lbs 6 oz 154 lbs  BMI  30.89 31.09  Systolic 140 145 161  Diastolic 88 91 85  Pulse  110 096   Initial bp today 145/91 Rpt manual 140/88 P 95  Gen: Alert, well appearing.  Patient is oriented to person, place, time, and situation. AFFECT: pleasant, lucid thought and speech. ENT: Ears: EACs clear, normal epithelium.  TMs with good light reflex and landmarks bilaterally.  Eyes: no injection, icteris, swelling, or exudate.  EOMI, PERRLA. Nose: no drainage or turbinate edema/swelling.  No injection or focal lesion.  Mouth: lips without lesion/swelling.  Oral mucosa pink and moist.  Dentition intact and without obvious caries or gingival swelling.  Oropharynx without erythema, exudate,  or swelling.  Neck: supple/nontender.  No LAD, mass, or TM.  Carotid pulses 2+ bilaterally, without bruits. CV: RRR, no m/r/g.   LUNGS: CTA bilat, nonlabored resps, good aeration in all lung fields. ABD: soft, NT, ND, BS normal.  No hepatospenomegaly or mass.  No bruits. EXT: no clubbing, cyanosis, or edema.  Musculoskeletal: no joint swelling, erythema, warmth, or tenderness.  ROM of all joints intact. Skin - no sores or suspicious lesions or rashes or color changes Neuro: CN 2-12 intact bilaterally, strength 5/5 in proximal and distal upper extremities and lower extremities bilaterally.  No sensory deficits.  No tremor. No ataxia.   Pertinent labs:  Lab Results  Component Value Date   TSH 1.82 07/30/2020   Lab Results  Component Value Date   WBC 6.0 02/09/2022   HGB 14.2 02/09/2022   HCT 41.3 02/09/2022   MCV 90.0 02/09/2022   PLT 301.0 02/09/2022   Lab Results  Component Value Date   CREATININE 1.08 08/10/2022   BUN 13 08/10/2022   NA 137 08/10/2022   K 3.5 08/10/2022   CL 97 08/10/2022   CO2 32 08/10/2022   Lab Results  Component Value Date   ALT 106 (H) 08/10/2022   AST 61 (H) 08/10/2022   ALKPHOS 112 08/10/2022   BILITOT 0.6 08/10/2022   Lab Results  Component Value Date   CHOL 168 02/09/2022   Lab Results  Component Value Date   HDL 51.70 02/09/2022   Lab Results  Component Value Date   LDLCALC 78 02/09/2022   Lab Results  Component Value Date   TRIG 194.0 (H) 02/09/2022   Lab Results  Component Value Date   CHOLHDL 3 02/09/2022   Lab Results  Component Value Date   HGBA1C 5.9 08/10/2022   ASSESSMENT AND PLAN:   #1 health maintenance exam: Reviewed age and gender appropriate health maintenance issues (prudent diet, regular exercise, health risks of tobacco and excessive alcohol, use of seatbelts,  fire alarms in home, use of sunscreen).  Also reviewed age and gender appropriate health screening as well as vaccine recommendations. Vaccines:  Flu->declined.   Labs: cbc, cmet, flp, a1c. Cervical ca screening: pap via GYN in the past.  Patient estimates that its been about 5 years since she has been.  Pt to schedule routine f/u.  Patient recalls one abnormal Pap in the past, approximately 10 years ago, states that follow-up testing showed resolved--->phone # for Dr. Oscar La given to pt today. Breast ca screening: mammogram normal 09/2021.  Repeat is due--->ordered. Colon ca screening: Recall 02/2024  #2 hypertension, blood pressure elevated here at most visits.  She says it is consistently normal at home. Continue HCTZ 25 mg a day and 20 mill equivalents of Klor-Con daily. Electrolytes and creatinine monitoring today.  #3 hypercholesterolemia, doing well on a atorvastatin 20 mg a day. Lipid panel and hepatic panel today.  4.  Prediabetes. Monitor hemoglobin A1c today.  5.  Loose bowel movements. Unknown etiology. Observe/monitor.  Continue Metamucil.  6.  Visual field "flashing/lightening". Unknown etiology. Monitor at this time.  7.  GERD. Adjust diet. Start pantoprazole 40 mg a day and continue over-the-counter Pepcid nightly.  An After Visit Summary was printed and given to the patient.  FOLLOW UP:  Return for 1 month f/u GERD and loose BMs.  Signed:  Santiago Bumpers, MD           02/12/2023

## 2023-03-10 ENCOUNTER — Ambulatory Visit (HOSPITAL_BASED_OUTPATIENT_CLINIC_OR_DEPARTMENT_OTHER): Payer: BLUE CROSS/BLUE SHIELD | Admitting: Radiology

## 2023-03-15 ENCOUNTER — Encounter: Payer: Self-pay | Admitting: Family Medicine

## 2023-03-15 ENCOUNTER — Ambulatory Visit: Payer: BLUE CROSS/BLUE SHIELD | Admitting: Family Medicine

## 2023-03-15 VITALS — BP 136/86 | HR 97 | Wt 163.8 lb

## 2023-03-15 DIAGNOSIS — K219 Gastro-esophageal reflux disease without esophagitis: Secondary | ICD-10-CM | POA: Diagnosis not present

## 2023-03-15 DIAGNOSIS — K760 Fatty (change of) liver, not elsewhere classified: Secondary | ICD-10-CM

## 2023-03-15 DIAGNOSIS — K7581 Nonalcoholic steatohepatitis (NASH): Secondary | ICD-10-CM | POA: Diagnosis not present

## 2023-03-15 DIAGNOSIS — R195 Other fecal abnormalities: Secondary | ICD-10-CM

## 2023-03-15 NOTE — Progress Notes (Signed)
OFFICE VISIT  03/15/2023  CC:  Chief Complaint  Patient presents with   Gastroesophageal Reflux    Pt states since last appt she feels she has improved.    Diarrhea    Patient is a 62 y.o. female who presents for 1 month follow-up diarrhea and GERD. A/P as of last visit: "1 hypertension, blood pressure elevated here at most visits.  She says it is consistently normal at home. Continue HCTZ 25 mg a day and 20 mill equivalents of Klor-Con daily. Electrolytes and creatinine monitoring today.   #2 hypercholesterolemia, doing well on a atorvastatin 20 mg a day. Lipid panel and hepatic panel today.   3.  Prediabetes. Monitor hemoglobin A1c today.   4.  Loose bowel movements. Unknown etiology. Observe/monitor.  Continue Metamucil.   5.  Visual field "flashing/lightening". Unknown etiology. Monitor at this time.   6.  GERD. Adjust diet. Start pantoprazole 40 mg a day and continue over-the-counter Pepcid nightly."  INTERIM HX: Labs last visit all normal except stable mild elevation of transaminases and GFR stable at 58.  She says she is doing great now. No more reflux since taking pantoprazole 40 mg in the morning and Pepcid 20 mg at bedtime. Her stools are significantly less frequent and slightly loose but not watery.  No abdominal pain.  No blood in stool.  She has an unhealthy diet, per her admission today. She does not exercise.  Past Medical History:  Diagnosis Date   Asymptomatic cholelithiasis 07/2020   Chronic renal insufficiency, stage II (mild)    GFR 60s   Elevated transaminase level    Viral hep serologies neg. Fatty liver on u/s 07/2020.   Fatty liver 07/2020   u/s documented   History of abnormal Pap smear    AGUS; ASCUS, with high risk HPV+, (colpo neg 09/2012--repeat pap 12/16/15 nl, no HR HPV--per GYN needs pap and HR HPV testing again in 3 yrs))   History of adenomatous polyp of colon 2015, 2020   Recall 02/2024   History of colitis    focal area of  chronic active colitis on colonoscopy 02/2019   Hyperlipemia, mixed 02/2019   Great response to atorva    Hypertension    IFG (impaired fasting glucose) 07/2016   HbA1c 5.5%   Osteoarthritis    fingers   Seasonal allergic rhinitis 09/10/2012    Past Surgical History:  Procedure Laterality Date   COLONOSCOPY W/ POLYPECTOMY  08/2013; 02/19/2019   02/2019 focal colitis (cecum).->path showed chronic active colitis.  Adenoma x 1. Recall 48yr   COLPOSCOPY     POLYPECTOMY     Unremarkable      Outpatient Medications Prior to Visit  Medication Sig Dispense Refill   atorvastatin (LIPITOR) 20 MG tablet Take 1 tablet (20 mg total) by mouth daily. 90 tablet 1   cetirizine (ZYRTEC) 10 MG tablet Take 10 mg by mouth daily.     hydrochlorothiazide (HYDRODIURIL) 25 MG tablet Take 1 tablet (25 mg total) by mouth daily. 90 tablet 1   pantoprazole (PROTONIX) 40 MG tablet Take 1 tablet (40 mg total) by mouth daily. 30 tablet 3   potassium chloride SA (KLOR-CON M20) 20 MEQ tablet Take 1 tablet (20 mEq total) by mouth daily. 90 tablet 1   triamcinolone (NASACORT ALLERGY 24HR) 55 MCG/ACT AERO nasal inhaler Place 2 sprays into the nose daily.     No facility-administered medications prior to visit.    No Known Allergies  Review of Systems As per HPI  PE:    03/15/2023    1:04 PM 02/12/2023    8:11 AM 02/12/2023    8:03 AM  Vitals with BMI  Height   5\' 1"   Weight 163 lbs 13 oz  163 lbs 6 oz  BMI   30.89  Systolic 136 140 433  Diastolic 86 88 91  Pulse 97  110     Physical Exam  Gen: Alert, well appearing.  Patient is oriented to person, place, time, and situation. No further exam today.  LABS:  Last CBC Lab Results  Component Value Date   WBC 7.0 02/12/2023   HGB 14.4 02/12/2023   HCT 43.0 02/12/2023   MCV 92.0 02/12/2023   MCH 31.8 07/30/2020   RDW 13.6 02/12/2023   PLT 308.0 02/12/2023   Last metabolic panel Lab Results  Component Value Date   GLUCOSE 100 (H) 02/12/2023    NA 143 02/12/2023   K 3.7 02/12/2023   CL 101 02/12/2023   CO2 30 02/12/2023   BUN 14 02/12/2023   CREATININE 1.03 02/12/2023   GFR 58.05 (L) 02/12/2023   CALCIUM 9.8 02/12/2023   PROT 7.5 02/12/2023   ALBUMIN 4.4 02/12/2023   BILITOT 0.8 02/12/2023   ALKPHOS 90 02/12/2023   AST 53 (H) 02/12/2023   ALT 108 (H) 02/12/2023   Last lipids Lab Results  Component Value Date   CHOL 156 02/12/2023   HDL 49.90 02/12/2023   LDLCALC 69 02/12/2023   LDLDIRECT 144.0 03/04/2019   TRIG 186.0 (H) 02/12/2023   CHOLHDL 3 02/12/2023   Last hemoglobin A1c Lab Results  Component Value Date   HGBA1C 5.9 02/12/2023   Last thyroid functions Lab Results  Component Value Date   TSH 1.82 07/30/2020   T3TOTAL 140.0 07/28/2016   IMPRESSION AND PLAN:  #1 GERD. Now well-controlled on pantoprazole 40 mg every morning and Pepcid 20 mg every afternoon. Discussed the need to reevaluate periodically and consider weaning back on medication.  First thing I would do would be stop the Pepcid in the evening.  She will consider this in the future.  2.  Loose bowel movements.  Resolving.  Unknown etiology. She possibly has some functional GI symptoms as well as some dietary-induced symptoms.  #3 obesity, with mild NASH. Discussed the importance of weight loss. She describes a lack of motivation to change lifestyle, but now has a plan of trying to count calories, limit intake to 2000 calories a day, and start walking on her treadmill daily.  An After Visit Summary was printed and given to the patient.  FOLLOW UP: Return in about 6 months (around 09/12/2023) for routine chronic illness f/u.  Signed:  Santiago Bumpers, MD           03/15/2023

## 2023-05-11 ENCOUNTER — Other Ambulatory Visit: Payer: Self-pay | Admitting: Family Medicine

## 2023-06-14 ENCOUNTER — Ambulatory Visit (HOSPITAL_BASED_OUTPATIENT_CLINIC_OR_DEPARTMENT_OTHER)
Admission: RE | Admit: 2023-06-14 | Discharge: 2023-06-14 | Disposition: A | Discharge status: Home / Self Care | Payer: BLUE CROSS/BLUE SHIELD | Source: Ambulatory Visit | Attending: Certified Nurse Midwife | Admitting: Certified Nurse Midwife

## 2023-06-14 ENCOUNTER — Other Ambulatory Visit (HOSPITAL_COMMUNITY)
Admission: RE | Admit: 2023-06-14 | Discharge: 2023-06-14 | Disposition: A | Discharge status: Home / Self Care | Payer: BLUE CROSS/BLUE SHIELD | Source: Ambulatory Visit | Attending: Certified Nurse Midwife | Admitting: Certified Nurse Midwife

## 2023-06-14 ENCOUNTER — Ambulatory Visit (HOSPITAL_BASED_OUTPATIENT_CLINIC_OR_DEPARTMENT_OTHER): Payer: BLUE CROSS/BLUE SHIELD | Admitting: Certified Nurse Midwife

## 2023-06-14 ENCOUNTER — Encounter (HOSPITAL_BASED_OUTPATIENT_CLINIC_OR_DEPARTMENT_OTHER): Payer: Self-pay | Admitting: Radiology

## 2023-06-14 ENCOUNTER — Encounter (HOSPITAL_BASED_OUTPATIENT_CLINIC_OR_DEPARTMENT_OTHER): Payer: Self-pay | Admitting: Certified Nurse Midwife

## 2023-06-14 VITALS — BP 152/89 | HR 111 | Ht 61.0 in | Wt 163.0 lb

## 2023-06-14 DIAGNOSIS — Z124 Encounter for screening for malignant neoplasm of cervix: Secondary | ICD-10-CM

## 2023-06-14 DIAGNOSIS — Z1231 Encounter for screening mammogram for malignant neoplasm of breast: Secondary | ICD-10-CM

## 2023-06-14 DIAGNOSIS — Z01419 Encounter for gynecological examination (general) (routine) without abnormal findings: Secondary | ICD-10-CM

## 2023-06-14 NOTE — Progress Notes (Signed)
63 y.o. [id number] Married Postmenopausal  White or Caucasian female here for annual exam. "Bobbi" lives with her spouse. She grew up on [place]. Has 2 daughters and 3 grand-children (near). She cares for the youngest grand-son who is 12 months. 2 grand-daughters go to pre-school. Pt planning repeat Colonoscopy soon (hx polyps).   Patient's last menstrual period was 09/07/2012.          Sexually active: Yes.    The current method of family planning is post menopausal status.     The pregnancy intention screening data noted above was reviewed. Potential methods of contraception were discussed. The patient elected to proceed with No data recorded.  Exercising: Pt trying to start exercising (walking), wants to become more active    Smoker:  no  Health Maintenance: Pap:  Pap 2022 Neg, repeat pap planned today History of abnormal Pap:  yes MMG:  Pt plans mammogram today Colonoscopy:  Pt plans mammogram in next few months BMD:   Plan at age 78 Screening Labs: Dr. Milinda Cave MD- PCP   reports that she has never smoked. She has never used smokeless tobacco. She reports that she does not drink alcohol and does not use drugs.  Past Medical History:  Diagnosis Date   Asymptomatic cholelithiasis 07/2020   Chronic renal insufficiency, stage II (mild)    GFR 60s   Elevated transaminase level    Viral hep serologies neg. Fatty liver on u/s 07/2020.   Fatty liver 07/2020   u/s documented   GERD (gastroesophageal reflux disease)    History of abnormal Pap smear    AGUS; ASCUS, with high risk HPV+, (colpo neg 09/2012--repeat pap 12/16/15 nl, no HR HPV--per GYN needs pap and HR HPV testing again in 3 yrs))   History of adenomatous polyp of colon 2015, 2020   Recall 02/2024   History of colitis    focal area of chronic active colitis on colonoscopy 02/2019   Hyperlipemia, mixed 02/2019   Great response to atorva    Hypertension    IFG (impaired fasting glucose) 07/2016   HbA1c 5.5%   Osteoarthritis     fingers   Seasonal allergic rhinitis 09/10/2012    Past Surgical History:  Procedure Laterality Date   COLONOSCOPY W/ POLYPECTOMY  08/2013; 02/19/2019   02/2019 focal colitis (cecum).->path showed chronic active colitis.  Adenoma x 1. Recall 56yr   COLPOSCOPY     POLYPECTOMY     Unremarkable      Current Outpatient Medications  Medication Sig Dispense Refill   atorvastatin (LIPITOR) 20 MG tablet Take 1 tablet (20 mg total) by mouth daily. 90 tablet 1   cetirizine (ZYRTEC) 10 MG tablet Take 10 mg by mouth daily.     hydrochlorothiazide (HYDRODIURIL) 25 MG tablet Take 1 tablet (25 mg total) by mouth daily. 90 tablet 1   pantoprazole (PROTONIX) 40 MG tablet Take 1 tablet (40 mg total) by mouth daily. 30 tablet 3   potassium chloride SA (KLOR-CON M20) 20 MEQ tablet Take 1 tablet (20 mEq total) by mouth daily. 90 tablet 1   triamcinolone (NASACORT ALLERGY 24HR) 55 MCG/ACT AERO nasal inhaler Place 2 sprays into the nose daily.     No current facility-administered medications for this visit.    Family History  Problem Relation Age of Onset   Alcoholism Maternal Grandfather    Alcoholism Brother    Arthritis Father        Alive   Hypertension Father    Heart disease Maternal  Grandmother    Diabetes Maternal Grandmother    Cancer - Other Maternal Grandmother        Intestinal   Pancreatic cancer Maternal Grandmother    Hypertension Mother        Alive   Diabetes Mother    Cancer Mother    Uterine cancer Mother    Diabetes Paternal Grandmother    Leukemia Sister    Colon cancer Neg Hx    Rectal cancer Neg Hx    Stomach cancer Neg Hx    Colon polyps Neg Hx    Esophageal cancer Neg Hx     ROS: Constitutional: negative Genitourinary:negative  Exam:   BP (!) 152/89 (BP Location: Left Arm, Patient Position: Sitting, Cuff Size: Normal)   Pulse (!) 111   Ht 5\' 1"  (1.549 m)   Wt 163 lb (73.9 kg)   LMP 09/07/2012   BMI 30.80 kg/m   Height: 5\' 1"  (154.9 cm)  General  appearance: alert, cooperative and appears stated age Head: Normocephalic, without obvious abnormality, atraumatic Neck: no adenopathy, supple, symmetrical, trachea midline  Lungs: clear to auscultation bilaterally Breasts: normal appearance, no masses or tenderness, Inspection negative, No nipple retraction or dimpling, No nipple discharge or bleeding, No axillary or supraclavicular adenopathy, Normal to palpation without dominant masses Heart: regular rate and rhythm Abdomen: soft, non-tender; bowel sounds normal; no masses,  no organomegaly Extremities: extremities normal, atraumatic, no cyanosis or edema Skin: Skin color, texture, turgor normal. No rashes or lesions Lymph nodes: Cervical, supraclavicular, and axillary nodes normal. No abnormal inguinal nodes palpated Neurologic: Grossly normal   Pelvic: External genitalia:  no lesions              Urethra:  normal appearing urethra with no masses, tenderness or lesions              Bartholins and Skenes: normal                 Vagina: normal appearing vagina with normal color and no discharge, no lesions              Cervix: multiparous appearance, no bleeding following Pap, no cervical motion tenderness, and no lesions              Pap taken: Yes.   Bimanual Exam:  Uterus:  normal size, contour, position, consistency, mobility, non-tender              Adnexa: no mass, fullness, tenderness             Anus:  normal sphincter tone, no lesions  Chaperone, Hendricks Milo, CMA, was present for exam.  Assessment/Plan: 1. Encounter for gynecological examination without abnormal finding  2. Encounter for screening mammogram for malignant neoplasm of breast (Primary) - continue annual screening mammograms - MM 3D SCREENING MAMMOGRAM BILATERAL BREAST; Future  3. Cervical cancer screening - Cytology - PAP( [place])   RTO 1 year for annual gyn exam and prn if issues arise. Letta Kocher

## 2023-06-19 LAB — CYTOLOGY - PAP
Comment: NEGATIVE
Diagnosis: NEGATIVE
High risk HPV: NEGATIVE

## 2023-06-20 ENCOUNTER — Encounter (HOSPITAL_BASED_OUTPATIENT_CLINIC_OR_DEPARTMENT_OTHER): Payer: Self-pay | Admitting: Certified Nurse Midwife

## 2023-08-18 ENCOUNTER — Other Ambulatory Visit: Payer: Self-pay | Admitting: Family Medicine

## 2023-08-20 ENCOUNTER — Encounter: Payer: Self-pay | Admitting: Family Medicine

## 2023-08-20 ENCOUNTER — Other Ambulatory Visit: Payer: Self-pay

## 2023-08-20 ENCOUNTER — Other Ambulatory Visit: Payer: Self-pay | Admitting: Family Medicine

## 2023-08-20 MED ORDER — PANTOPRAZOLE SODIUM 40 MG PO TBEC: 40.0000 mg | DELAYED_RELEASE_TABLET | Freq: Every day | ORAL | 0 refills | Status: DC

## 2023-09-11 ENCOUNTER — Other Ambulatory Visit: Payer: Self-pay | Admitting: Family Medicine

## 2023-09-11 NOTE — Telephone Encounter (Signed)
 Pt has upcoming appt on 4/30

## 2023-09-12 ENCOUNTER — Ambulatory Visit: Admitting: Family Medicine

## 2023-09-12 ENCOUNTER — Encounter: Payer: Self-pay | Admitting: Family Medicine

## 2023-09-12 VITALS — BP 140/90 | HR 111 | Temp 99.1°F | Ht 61.0 in | Wt 162.8 lb

## 2023-09-12 DIAGNOSIS — K7581 Nonalcoholic steatohepatitis (NASH): Secondary | ICD-10-CM | POA: Diagnosis not present

## 2023-09-12 DIAGNOSIS — K219 Gastro-esophageal reflux disease without esophagitis: Secondary | ICD-10-CM

## 2023-09-12 DIAGNOSIS — I10 Essential (primary) hypertension: Secondary | ICD-10-CM | POA: Diagnosis not present

## 2023-09-12 DIAGNOSIS — E7800 Pure hypercholesterolemia, unspecified: Secondary | ICD-10-CM | POA: Diagnosis not present

## 2023-09-12 DIAGNOSIS — R7303 Prediabetes: Secondary | ICD-10-CM

## 2023-09-12 LAB — COMPREHENSIVE METABOLIC PANEL WITH GFR
ALT: 106 U/L — ABNORMAL HIGH (ref 0–35)
AST: 56 U/L — ABNORMAL HIGH (ref 0–37)
Albumin: 4.7 g/dL (ref 3.5–5.2)
Alkaline Phosphatase: 101 U/L (ref 39–117)
BUN: 15 mg/dL (ref 6–23)
CO2: 32 meq/L (ref 19–32)
Calcium: 10 mg/dL (ref 8.4–10.5)
Chloride: 101 meq/L (ref 96–112)
Creatinine, Ser: 1.07 mg/dL (ref 0.40–1.20)
GFR: 55.23 mL/min — ABNORMAL LOW (ref >60.00)
Glucose, Bld: 112 mg/dL — ABNORMAL HIGH (ref 70–99)
Potassium: 4.3 meq/L (ref 3.5–5.1)
Sodium: 142 meq/L (ref 135–145)
Total Bilirubin: 0.6 mg/dL (ref 0.2–1.2)
Total Protein: 7.6 g/dL (ref 6.0–8.3)

## 2023-09-12 LAB — HEMOGLOBIN A1C: Hgb A1c MFr Bld: 5.9 % (ref 4.6–6.5)

## 2023-09-12 MED ORDER — HYDROCHLOROTHIAZIDE 25 MG PO TABS: 25.0000 mg | ORAL_TABLET | Freq: Every day | ORAL | 1 refills | Status: DC

## 2023-09-12 MED ORDER — PANTOPRAZOLE SODIUM 40 MG PO TBEC: 40.0000 mg | DELAYED_RELEASE_TABLET | Freq: Two times a day (BID) | ORAL | 1 refills | Status: DC

## 2023-09-12 MED ORDER — ATORVASTATIN CALCIUM 20 MG PO TABS: 20.0000 mg | ORAL_TABLET | Freq: Every day | ORAL | 1 refills | Status: DC

## 2023-09-12 MED ORDER — POTASSIUM CHLORIDE CRYS ER 20 MEQ PO TBCR: 20.0000 meq | EXTENDED_RELEASE_TABLET | Freq: Every day | ORAL | 1 refills | Status: DC

## 2023-09-12 NOTE — Progress Notes (Signed)
 OFFICE VISIT  09/12/2023  CC:  Chief Complaint  Patient presents with   Medical Management of Chronic Issues    Patient is a 63 y.o. female who presents for 8-month follow-up hypertension, hyperlipidemia, prediabetes, NASH, GERD, and history of functional lower l GI symptoms. A/P as of last visit: "1 GERD. Now well-controlled on pantoprazole  40 mg every morning and Pepcid 20 mg every afternoon. Discussed the need to reevaluate periodically and consider weaning back on medication.  First thing I would do would be stop the Pepcid in the evening.  She will consider this in the future.   2.  Loose bowel movements.  Resolving.  Unknown etiology. She possibly has some functional GI symptoms as well as some dietary-induced symptoms.   #3 obesity, with mild NASH. Discussed the importance of weight loss. She describes a lack of motivation to change lifestyle, but now has a plan of trying to count calories, limit intake to 2000 calories a day, and start walking on her treadmill daily."  INTERIM HX: Bobbi is doing well overall.  She does still have some almost nightly breakthrough substernal burning in the middle of the night. It improves a little bit when she lays over on her stomach.  She sometimes will take Tums or Pepto-Bismol. No daytime reflux symptoms anymore. She takes pantoprazole  40 mg every morning.  She does not eat a very healthy diet in general.  She does not exercise.  She finds it hard to motivate herself to improve diet and exercise habits. Home blood pressures are consistently in the 130/80 range. Says heart rate usually in the 90s.  ROS as above, plus--> no fevers, no exertional CP, no SOB, no wheezing, no cough, no dizziness, no HAs, no rashes, no melena/hematochezia.  No polyuria or polydipsia.  No myalgias or arthralgias.  No focal weakness, paresthesias, or tremors.  No acute vision or hearing abnormalities.  No dysuria or unusual/new urinary urgency or frequency.  No  recent changes in lower legs. No n/v/d or abd pain.  No palpitations.      Past Medical History:  Diagnosis Date   Asymptomatic cholelithiasis 07/2020   Chronic renal insufficiency, stage II (mild)    GFR 60s   Elevated transaminase level    Viral hep serologies neg. Fatty liver on u/s 07/2020.   Fatty liver 07/2020   u/s documented   GERD (gastroesophageal reflux disease)    History of abnormal Pap smear    AGUS; ASCUS, with high risk HPV+, (colpo neg 09/2012--repeat pap 12/16/15 nl, no HR HPV--per GYN needs pap and HR HPV testing again in 3 yrs))   History of adenomatous polyp of colon 2015, 2020   Recall 02/2024   History of colitis    focal area of chronic active colitis on colonoscopy 02/2019   Hyperlipemia, mixed 02/2019   Great response to atorva    Hypertension    IFG (impaired fasting glucose) 07/2016   HbA1c 5.5%   Osteoarthritis    fingers   Seasonal allergic rhinitis 09/10/2012    Past Surgical History:  Procedure Laterality Date   COLONOSCOPY W/ POLYPECTOMY  08/2013; 02/19/2019   02/2019 focal colitis (cecum).->path showed chronic active colitis.  Adenoma x 1. Recall 70yr   COLPOSCOPY     POLYPECTOMY     Unremarkable      Outpatient Medications Prior to Visit  Medication Sig Dispense Refill   cetirizine (ZYRTEC) 10 MG tablet Take 10 mg by mouth daily.     triamcinolone  (NASACORT  ALLERGY  24HR) 55 MCG/ACT AERO nasal inhaler Place 2 sprays into the nose daily.     atorvastatin  (LIPITOR) 20 MG tablet Take 1 tablet (20 mg total) by mouth daily. OFFICE VISIT NEEDED FOR FURTHER REFILLS 30 tablet 0   hydrochlorothiazide  (HYDRODIURIL ) 25 MG tablet Take 1 tablet (25 mg total) by mouth daily. OFFICE VISIT NEEDED FOR FURTHER REFILLS 30 tablet 0   pantoprazole  (PROTONIX ) 40 MG tablet Take 1 tablet (40 mg total) by mouth daily. 30 tablet 0   potassium chloride  SA (KLOR-CON  M20) 20 MEQ tablet Take 1 tablet (20 mEq total) by mouth daily. OFFICE VISIT NEEDED FOR FURTHER REFILLS  30 tablet 0   No facility-administered medications prior to visit.    No Known Allergies  Review of Systems As per HPI  PE:    09/12/2023    1:16 PM 09/12/2023    1:11 PM 06/14/2023    9:40 AM  Vitals with BMI  Height  5\' 1"  5\' 1"   Weight  162 lbs 13 oz 163 lbs  BMI  30.78 30.81  Systolic 140 148 604  Diastolic 90 86 89  Pulse  111 540     Physical Exam  Gen: Alert, well appearing.  Patient is oriented to person, place, time, and situation. AFFECT: pleasant, lucid thought and speech. CV: RRR, (rate approximately 90-95) no m/r/g.   LUNGS: CTA bilat, nonlabored resps, good aeration in all lung fields. EXT: no clubbing or cyanosis.  no edema.    LABS:  Last CBC Lab Results  Component Value Date   WBC 7.0 02/12/2023   HGB 14.4 02/12/2023   HCT 43.0 02/12/2023   MCV 92.0 02/12/2023   MCH 31.8 07/30/2020   RDW 13.6 02/12/2023   PLT 308.0 02/12/2023   Last metabolic panel Lab Results  Component Value Date   GLUCOSE 100 (H) 02/12/2023   NA 143 02/12/2023   K 3.7 02/12/2023   CL 101 02/12/2023   CO2 30 02/12/2023   BUN 14 02/12/2023   CREATININE 1.03 02/12/2023   GFR 58.05 (L) 02/12/2023   CALCIUM  9.8 02/12/2023   PROT 7.5 02/12/2023   ALBUMIN 4.4 02/12/2023   BILITOT 0.8 02/12/2023   ALKPHOS 90 02/12/2023   AST 53 (H) 02/12/2023   ALT 108 (H) 02/12/2023   Last lipids Lab Results  Component Value Date   CHOL 156 02/12/2023   HDL 49.90 02/12/2023   LDLCALC 69 02/12/2023   LDLDIRECT 144.0 03/04/2019   TRIG 186.0 (H) 02/12/2023   CHOLHDL 3 02/12/2023   Last hemoglobin A1c Lab Results  Component Value Date   HGBA1C 5.9 02/12/2023   Last thyroid functions Lab Results  Component Value Date   TSH 1.82 07/30/2020   T3TOTAL 140.0 07/28/2016   IMPRESSION AND PLAN:  #1 uncontrolled GERD. Encouraged her to continue to try to make some dietary improvements. Will increase her pantoprazole  to 40 mg morning and night.  2 hypertension, well-controlled  on HCTZ 25 mg a day. Metabolic panel today. Of note, she has a normal resting heart rate in the 90s and this usually goes up more here in the office.  She is asymptomatic and this has been stable.  3.  Hypercholesterolemia, doing well on a atorvastatin  20 mg a day. LDL was 69 about 6 months ago.  She is not fasting today. We will check hepatic panel today. Repeat lipid panel 6 months.  4.  Prediabetes. Encouraged her to make increased effort at improved diet and exercise habits. Check hemoglobin A1c today.  An After Visit Summary was printed and given to the patient.  FOLLOW UP: Return in about 2 weeks (around 09/26/2023) for f/u gerd. Next CPE around 02/12/2024 Signed:  Arletha Lady, MD           09/12/2023

## 2023-09-13 ENCOUNTER — Encounter: Payer: Self-pay | Admitting: Family Medicine

## 2023-09-14 ENCOUNTER — Other Ambulatory Visit: Payer: Self-pay | Admitting: Family Medicine

## 2023-09-15 ENCOUNTER — Other Ambulatory Visit: Payer: Self-pay | Admitting: Family Medicine

## 2023-09-17 ENCOUNTER — Other Ambulatory Visit (HOSPITAL_COMMUNITY): Payer: Self-pay

## 2023-09-17 ENCOUNTER — Telehealth: Payer: Self-pay

## 2023-09-17 NOTE — Telephone Encounter (Signed)
 Please assist with PA.

## 2023-09-17 NOTE — Telephone Encounter (Signed)
 Pharmacy Patient Advocate Encounter   Received notification from CoverMyMeds that prior authorization for Pantoprazole  Sodium 40MG  dr tablets  is required/requested.   Insurance verification completed.   The patient is insured through [employer]  .   Per test claim: PA required; PA started via CoverMyMeds. KEY [id number] . Waiting for clinical questions to populate.

## 2023-09-18 ENCOUNTER — Other Ambulatory Visit (HOSPITAL_COMMUNITY): Payer: Self-pay

## 2023-09-18 NOTE — Telephone Encounter (Signed)
 Clinical questions have been answered and PA submitted.TO PLAN. PA currently Pending.

## 2023-09-25 ENCOUNTER — Other Ambulatory Visit (HOSPITAL_COMMUNITY): Payer: Self-pay

## 2023-09-27 ENCOUNTER — Ambulatory Visit: Admitting: Family Medicine

## 2023-10-04 ENCOUNTER — Other Ambulatory Visit (HOSPITAL_COMMUNITY): Payer: Self-pay

## 2023-10-04 NOTE — Telephone Encounter (Signed)
 FYI has been reviewed.

## 2023-10-04 NOTE — Telephone Encounter (Signed)
 PLEASE BE ADVISED RECEIVED NOTIFICATION FOR PANTOPRAZOLE  FOR PLAN EXCEED 2 PER DAY.  Called on pending status outcome it was sent via CMM but was accepted so plan is faxing  clinical question with in 48-72 hours to continue with processing PA.

## 2023-10-10 NOTE — Telephone Encounter (Signed)
 Additional information has been requested from the patient's insurance in order to proceed with the prior authorization request. Requested information has been sent, or form has been filled out and faxed back to [phone]

## 2023-10-11 ENCOUNTER — Other Ambulatory Visit (HOSPITAL_COMMUNITY): Payer: Self-pay

## 2023-10-11 NOTE — Telephone Encounter (Signed)
 Pharmacy Patient Advocate Encounter  Received notification from [facility] that Prior Authorization for Pantoprazole  40mg  tabs has been APPROVED from 10/10/23 to 10/09/24. Ran test claim, Copay is $30.00/ 90 days . This test claim was processed through [facility]- copay amounts may vary at other pharmacies due to pharmacy/plan contracts, or as the patient moves through the different stages of their insurance plan.   PA #/Case ID/Reference #: [id number]

## 2023-11-01 ENCOUNTER — Ambulatory Visit: Admitting: Family Medicine

## 2023-11-09 ENCOUNTER — Ambulatory Visit: Admitting: Family Medicine

## 2023-11-09 ENCOUNTER — Encounter: Payer: Self-pay | Admitting: Family Medicine

## 2023-11-09 VITALS — BP 150/92 | HR 109 | Wt 165.6 lb

## 2023-11-09 DIAGNOSIS — K219 Gastro-esophageal reflux disease without esophagitis: Secondary | ICD-10-CM | POA: Diagnosis not present

## 2023-11-09 DIAGNOSIS — E66811 Obesity, class 1: Secondary | ICD-10-CM

## 2023-11-09 DIAGNOSIS — Z7689 Persons encountering health services in other specified circumstances: Secondary | ICD-10-CM

## 2023-11-09 MED ORDER — PHENTERMINE HCL 15 MG PO CAPS: 15.0000 mg | ORAL_CAPSULE | ORAL | 0 refills | Status: DC

## 2023-11-09 MED ORDER — POTASSIUM CHLORIDE CRYS ER 20 MEQ PO TBCR: 20.0000 meq | EXTENDED_RELEASE_TABLET | Freq: Every day | ORAL | 3 refills | Status: AC

## 2023-11-09 NOTE — Progress Notes (Signed)
 OFFICE VISIT  11/09/2023  CC:  Chief Complaint  Patient presents with   Follow-up    Follow up on heartburn. Pt states since she has increased the pantoprazole  she feels better. She would also like to discuss weight loss options. She also needs a refill on potassium chloride     Patient is a 63 y.o. female who presents for 83-month follow-up GERD. A/P as of last visit: #1 uncontrolled GERD. Encouraged her to continue to try to make some dietary improvements. Will increase her pantoprazole  to 40 mg morning and night.   2 hypertension, well-controlled on HCTZ 25 mg a day. Metabolic panel today. Of note, she has a normal resting heart rate in the 90s and this usually goes up more here in the office.  She is asymptomatic and this has been stable.   3.  Hypercholesterolemia, doing well on a atorvastatin  20 mg a day. LDL was 69 about 6 months ago.  She is not fasting today. We will check hepatic panel today. Repeat lipid panel 6 months.   4.  Prediabetes. Encouraged her to make increased effort at improved diet and exercise habits. Check hemoglobin A1c today.  INTERIM HX:  Currently doing well from a reflux standpoint taking pantoprazole  40 mg twice a day.  She is frustrated with her weight. Has done some significant dieting in the past but has been unable to continue/maintain. Eating high calorie food in the evening is her biggest problem. Additionally, she does not have much motivation to exercise.  She has never been on a prescription weight loss medication.  Home blood pressures consistently less than 130/80.  Her heart rate is in the range of 85-105.  Denies palpitations, headaches, shortness of breath, chest pain, constipation, or diarrhea. No headaches, no dizziness. She has had a cough for a couple weeks that usually is in the evenings.  Nonproductive.  No fever, wheezing, upper respiratory congestion, or sore throat.  Past Medical History:  Diagnosis Date    Asymptomatic cholelithiasis 07/2020   Chronic renal insufficiency, stage II (mild)    GFR 60s   Elevated transaminase level    Viral hep serologies neg. Fatty liver on u/s 07/2020.   Fatty liver 07/2020   u/s documented   GERD (gastroesophageal reflux disease)    History of abnormal Pap smear    AGUS; ASCUS, with high risk HPV+, (colpo neg 09/2012--repeat pap 12/16/15 nl, no HR HPV--per GYN needs pap and HR HPV testing again in 3 yrs))   History of adenomatous polyp of colon 2015, 2020   Recall 02/2024   History of colitis    focal area of chronic active colitis on colonoscopy 02/2019   Hyperlipemia, mixed 02/2019   Great response to atorva    Hypertension    IFG (impaired fasting glucose) 07/2016   HbA1c 5.5%   Osteoarthritis    fingers   Seasonal allergic rhinitis 09/10/2012    Past Surgical History:  Procedure Laterality Date   COLONOSCOPY W/ POLYPECTOMY  08/2013; 02/19/2019   02/2019 focal colitis (cecum).->path showed chronic active colitis.  Adenoma x 1. Recall 69yr   COLPOSCOPY     POLYPECTOMY     Unremarkable      Outpatient Medications Prior to Visit  Medication Sig Dispense Refill   atorvastatin  (LIPITOR) 20 MG tablet Take 1 tablet (20 mg total) by mouth daily. 90 tablet 1   cetirizine (ZYRTEC) 10 MG tablet Take 10 mg by mouth daily.     hydrochlorothiazide  (HYDRODIURIL ) 25 MG tablet Take  1 tablet (25 mg total) by mouth daily. 90 tablet 1   pantoprazole  (PROTONIX ) 40 MG tablet Take 1 tablet (40 mg total) by mouth 2 (two) times daily. 180 tablet 1   triamcinolone  (NASACORT  ALLERGY 24HR) 55 MCG/ACT AERO nasal inhaler Place 2 sprays into the nose daily.     potassium chloride  SA (KLOR-CON  M20) 20 MEQ tablet Take 1 tablet (20 mEq total) by mouth daily. 90 tablet 1   No facility-administered medications prior to visit.    No Known Allergies  Review of Systems  As per HPI  PE:    11/09/2023    3:17 PM 11/09/2023    3:11 PM 09/12/2023    1:16 PM  Vitals with BMI   Weight  165 lbs 10 oz   Systolic 150 157 859  Diastolic 92 96 90  Pulse  109    Body mass index is 31.29 kg/m.  Physical Exam  Gen: Alert, well appearing.  Patient is oriented to person, place, time, and situation. AFFECT: pleasant, lucid thought and speech. No further exam today  LABS:  Last CBC Lab Results  Component Value Date   WBC 7.0 02/12/2023   HGB 14.4 02/12/2023   HCT 43.0 02/12/2023   MCV 92.0 02/12/2023   MCH 31.8 07/30/2020   RDW 13.6 02/12/2023   PLT 308.0 02/12/2023   Last metabolic panel Lab Results  Component Value Date   GLUCOSE 112 (H) 09/12/2023   NA 142 09/12/2023   K 4.3 09/12/2023   CL 101 09/12/2023   CO2 32 09/12/2023   BUN 15 09/12/2023   CREATININE 1.07 09/12/2023   GFR 55.23 (L) 09/12/2023   CALCIUM  10.0 09/12/2023   PROT 7.6 09/12/2023   ALBUMIN 4.7 09/12/2023   BILITOT 0.6 09/12/2023   ALKPHOS 101 09/12/2023   AST 56 (H) 09/12/2023   ALT 106 (H) 09/12/2023   Last hemoglobin A1c Lab Results  Component Value Date   HGBA1C 5.9 09/12/2023   IMPRESSION AND PLAN:  #1 GERD, doing well on pantoprazole  40 mg twice a day. Dietary modification has been discussed.  2.  Obesity class I.  Encounter for weight management. Discussed general dietary recommendations and exercise recommendations--> encouraged her to start doing a 15-minute walk daily. Will do trial of phentermine 15 mg a day. Therapeutic expectations and side effect profile of medication discussed today.  Patient's questions answered.  An After Visit Summary was printed and given to the patient.  FOLLOW UP: Return in about 4 weeks (around 12/07/2023) for f/u wt mgmt.  Signed:  Gerlene Hockey, MD           11/09/2023

## 2023-11-09 NOTE — Patient Instructions (Signed)
 For cough/congestion take over the counter mucinex dm as directed on the packaging.

## 2023-12-07 ENCOUNTER — Ambulatory Visit: Admitting: Family Medicine

## 2023-12-07 ENCOUNTER — Encounter: Payer: Self-pay | Admitting: Family Medicine

## 2023-12-07 VITALS — BP 136/86 | HR 127 | Temp 98.3°F | Resp 14 | Ht 61.0 in | Wt 159.0 lb

## 2023-12-07 DIAGNOSIS — E66811 Obesity, class 1: Secondary | ICD-10-CM | POA: Diagnosis not present

## 2023-12-07 DIAGNOSIS — R000 Tachycardia, unspecified: Secondary | ICD-10-CM | POA: Diagnosis not present

## 2023-12-07 DIAGNOSIS — Z7689 Persons encountering health services in other specified circumstances: Secondary | ICD-10-CM | POA: Diagnosis not present

## 2023-12-07 DIAGNOSIS — T887XXA Unspecified adverse effect of drug or medicament, initial encounter: Secondary | ICD-10-CM

## 2023-12-07 NOTE — Progress Notes (Signed)
 OFFICE VISIT  12/07/2023  CC:  Chief Complaint  Patient presents with   Weight Management    4 week f/u Phentermine    Patient is a 63 y.o. female who presents for 1 mo f/u obesity class I/wt mgmt A/P as of last visit: #1 GERD, doing well on pantoprazole  40 mg twice a day. Dietary modification has been discussed.   2.  Obesity class I.  Encounter for weight management. Discussed general dietary recommendations and exercise recommendations--> encouraged her to start doing a 15-minute walk daily. Will do trial of phentermine  15 mg a day. Therapeutic expectations and side effect profile of medication discussed today.  Patient's questions answered.  INTERIM HX: Belinda Cox feels well. She did notice appetite suppression on phentermine  and has lost about 6 pounds since starting it. She does not feel any side effects but her watch tells her that her heart rate is up pretty consistently.  Denies dizziness, chest pain, shortness of breath, palpitations, or headaches.  PMP AWARE reviewed today: most recent rx for phentermine  15mg  was filled 11/09/2023, # 30, rx by me. No red flags.  Past Medical History:  Diagnosis Date   Asymptomatic cholelithiasis 07/2020   Chronic renal insufficiency, stage II (mild)    GFR 60s   Elevated transaminase level    Viral hep serologies neg. Fatty liver on u/s 07/2020.   Fatty liver 07/2020   u/s documented   GERD (gastroesophageal reflux disease)    History of abnormal Pap smear    AGUS; ASCUS, with high risk HPV+, (colpo neg 09/2012--repeat pap 12/16/15 nl, no HR HPV--per GYN needs pap and HR HPV testing again in 3 yrs))   History of adenomatous polyp of colon 2015, 2020   Recall 02/2024   History of colitis    focal area of chronic active colitis on colonoscopy 02/2019   Hyperlipemia, mixed 02/2019   Great response to atorva    Hypertension    IFG (impaired fasting glucose) 07/2016   HbA1c 5.5%   Osteoarthritis    fingers   Seasonal allergic rhinitis  09/10/2012    Past Surgical History:  Procedure Laterality Date   COLONOSCOPY W/ POLYPECTOMY  08/2013; 02/19/2019   02/2019 focal colitis (cecum).->path showed chronic active colitis.  Adenoma x 1. Recall 78yr   COLPOSCOPY     POLYPECTOMY     Unremarkable      Outpatient Medications Prior to Visit  Medication Sig Dispense Refill   atorvastatin  (LIPITOR) 20 MG tablet Take 1 tablet (20 mg total) by mouth daily. 90 tablet 1   cetirizine (ZYRTEC) 10 MG tablet Take 10 mg by mouth daily.     hydrochlorothiazide  (HYDRODIURIL ) 25 MG tablet Take 1 tablet (25 mg total) by mouth daily. 90 tablet 1   pantoprazole  (PROTONIX ) 40 MG tablet Take 1 tablet (40 mg total) by mouth 2 (two) times daily. 180 tablet 1   potassium chloride  SA (KLOR-CON  M20) 20 MEQ tablet Take 1 tablet (20 mEq total) by mouth daily. 90 tablet 3   triamcinolone  (NASACORT  ALLERGY 24HR) 55 MCG/ACT AERO nasal inhaler Place 2 sprays into the nose daily.     phentermine  15 MG capsule Take 1 capsule (15 mg total) by mouth every morning. 30 capsule 0   No facility-administered medications prior to visit.    No Known Allergies  Review of Systems As per HPI  PE:    12/07/2023    1:44 PM 11/09/2023    3:17 PM 11/09/2023    3:11 PM  Vitals with  BMI  Height 5' 1    Weight 159 lbs  165 lbs 10 oz  BMI 30.06    Systolic 136 150 842  Diastolic 86 92 96  Pulse 127  890     Physical Exam  Gen: Alert, well appearing.  Patient is oriented to person, place, time, and situation. AFFECT: pleasant, lucid thought and speech. CV: Regular rhythm, tachycardia to 125, no murmur  LABS:  Last CBC Lab Results  Component Value Date   WBC 7.0 02/12/2023   HGB 14.4 02/12/2023   HCT 43.0 02/12/2023   MCV 92.0 02/12/2023   MCH 31.8 07/30/2020   RDW 13.6 02/12/2023   PLT 308.0 02/12/2023   Last metabolic panel Lab Results  Component Value Date   GLUCOSE 112 (H) 09/12/2023   NA 142 09/12/2023   K 4.3 09/12/2023   CL 101 09/12/2023    CO2 32 09/12/2023   BUN 15 09/12/2023   CREATININE 1.07 09/12/2023   GFR 55.23 (L) 09/12/2023   CALCIUM  10.0 09/12/2023   PROT 7.6 09/12/2023   ALBUMIN 4.7 09/12/2023   BILITOT 0.6 09/12/2023   ALKPHOS 101 09/12/2023   AST 56 (H) 09/12/2023   ALT 106 (H) 09/12/2023   Last lipids Lab Results  Component Value Date   CHOL 156 02/12/2023   HDL 49.90 02/12/2023   LDLCALC 69 02/12/2023   LDLDIRECT 144.0 03/04/2019   TRIG 186.0 (H) 02/12/2023   CHOLHDL 3 02/12/2023   Last hemoglobin A1c Lab Results  Component Value Date   HGBA1C 5.9 09/12/2023   Last thyroid functions Lab Results  Component Value Date   TSH 1.82 07/30/2020   T3TOTAL 140.0 07/28/2016   IMPRESSION AND PLAN:  Obesity class I/weight management. She has had some good response to phentermine  15 mg daily but it is raising her heart rate too much. Stop phentermine . She feels like she has started some better dietary habits and can continue to try to lose weight without medication at this time.  An After Visit Summary was printed and given to the patient.  FOLLOW UP: Return in about 3 months (around 03/08/2024) for routine chronic illness f/u.  Signed:  Gerlene Hockey, MD           12/07/2023

## 2024-02-15 ENCOUNTER — Other Ambulatory Visit: Payer: Self-pay | Admitting: Family Medicine

## 2024-04-02 ENCOUNTER — Ambulatory Visit (HOSPITAL_BASED_OUTPATIENT_CLINIC_OR_DEPARTMENT_OTHER)
Admission: RE | Admit: 2024-04-02 | Discharge: 2024-04-02 | Disposition: A | Discharge status: Home / Self Care | Source: Ambulatory Visit | Attending: Family Medicine | Admitting: Family Medicine

## 2024-04-02 ENCOUNTER — Ambulatory Visit: Admitting: Family Medicine

## 2024-04-02 ENCOUNTER — Ambulatory Visit: Payer: Self-pay | Admitting: Family Medicine

## 2024-04-02 ENCOUNTER — Encounter: Payer: Self-pay | Admitting: Family Medicine

## 2024-04-02 VITALS — BP 154/90 | HR 120 | Temp 97.7°F | Ht 61.0 in | Wt 161.8 lb

## 2024-04-02 DIAGNOSIS — R0781 Pleurodynia: Secondary | ICD-10-CM | POA: Diagnosis not present

## 2024-04-02 DIAGNOSIS — B349 Viral infection, unspecified: Secondary | ICD-10-CM | POA: Diagnosis not present

## 2024-04-02 DIAGNOSIS — R058 Other specified cough: Secondary | ICD-10-CM | POA: Insufficient documentation

## 2024-04-02 LAB — D-DIMER, QUANTITATIVE: D-Dimer, Quant: 0.51 ug{FEU}/mL — ABNORMAL HIGH (ref <0.50)

## 2024-04-02 NOTE — Progress Notes (Signed)
 OFFICE VISIT  04/02/2024  CC:  Chief Complaint  Patient presents with   Chest Congestion    Left side underarm pain; dry cough. Denies fatigue, body aches/chills, headache, sore throat. Has used aleve. No issues sleeping    Patient is a 63 y.o. female who presents for cough.  HPI: Onset 5 days ago, acute dry cough.  She says it feels like her chest is congested when she coughs.  At the same time she noted onset of nagging constant mild intensity pain in the left upper pectoral region extending some into the left armpit.  The pain is worsened with deep breaths and coughing. No wheezing, shortness of breath, fever, malaise, nasal congestion, postnasal drip, sore throat, or headache. No lower extremity pain or swelling. No palpitations or dizziness. No recent prolonged immobilization/travel, no recent surgical procedure, no personal or family history of thrombosis.  She has never been a smoker. Her heart rate and blood pressure are almost always elevated in our office.  At home her blood pressure is normal and says her heart rate is in the 80-90 range consistently at home.  Past Medical History:  Diagnosis Date   Asymptomatic cholelithiasis 07/2020   Chronic renal insufficiency, stage II (mild)    GFR 60s   Elevated transaminase level    Viral hep serologies neg. Fatty liver on u/s 07/2020.   Fatty liver 07/2020   u/s documented   GERD (gastroesophageal reflux disease)    History of abnormal Pap smear    AGUS; ASCUS, with high risk HPV+, (colpo neg 09/2012--repeat pap 12/16/15 nl, no HR HPV--per GYN needs pap and HR HPV testing again in 3 yrs))   History of adenomatous polyp of colon 2015, 2020   Recall 02/2024   History of colitis    focal area of chronic active colitis on colonoscopy 02/2019   Hyperlipemia, mixed 02/2019   Great response to atorva    Hypertension    IFG (impaired fasting glucose) 07/2016   HbA1c 5.5%   Osteoarthritis    fingers   Seasonal allergic rhinitis  09/10/2012    Past Surgical History:  Procedure Laterality Date   COLONOSCOPY W/ POLYPECTOMY  08/2013; 02/19/2019   02/2019 focal colitis (cecum).->path showed chronic active colitis.  Adenoma x 1. Recall 36yr   COLPOSCOPY     POLYPECTOMY     Unremarkable      Outpatient Medications Prior to Visit  Medication Sig Dispense Refill   atorvastatin  (LIPITOR) 20 MG tablet TAKE 1 TABLET BY MOUTH EVERY DAY 30 tablet 0   cetirizine (ZYRTEC) 10 MG tablet Take 10 mg by mouth daily.     hydrochlorothiazide  (HYDRODIURIL ) 25 MG tablet TAKE 1 TABLET (25 MG TOTAL) BY MOUTH DAILY. 30 tablet 0   pantoprazole  (PROTONIX ) 40 MG tablet Take 1 tablet (40 mg total) by mouth 2 (two) times daily. 180 tablet 1   potassium chloride  SA (KLOR-CON  M20) 20 MEQ tablet Take 1 tablet (20 mEq total) by mouth daily. 90 tablet 3   triamcinolone  (NASACORT  ALLERGY 24HR) 55 MCG/ACT AERO nasal inhaler Place 2 sprays into the nose daily.     No facility-administered medications prior to visit.    No Known Allergies  Review of Systems  As per HPI  PE:    04/02/2024   11:02 AM 04/02/2024   10:45 AM 12/07/2023    1:44 PM  Vitals with BMI  Height  5' 1 5' 1  Weight  161 lbs 13 oz 159 lbs  BMI  30.59 30.06  Systolic 154 147 863  Diastolic 90 87 86  Pulse  120 872  02 sat 95% on RA today  Physical Exam Exam chaperoned by Bobbetta Degree, CMA. Gen: Alert, well appearing.  Patient is oriented to person, place, time, and situation. AFFECT: pleasant, lucid thought and speech. No pallor. ZWU:Zbzd: no injection, icteris, swelling, or exudate.  EOMI, PERRLA. Mouth: lips without lesion/swelling.  Oral mucosa pink and moist. Oropharynx without erythema, exudate, or swelling.  Neck: no adenopathy CV: Regular rhythm, tachycardia to 110-115 range, no m/r/g.   LUNGS: CTA bilat, nonlabored resps, good aeration in all lung fields. No tenderness to palpation in the left chest wall or axilla. Extremities: No swelling or  tenderness.  LABS:  Last metabolic panel Lab Results  Component Value Date   GLUCOSE 112 (H) 09/12/2023   NA 142 09/12/2023   K 4.3 09/12/2023   CL 101 09/12/2023   CO2 32 09/12/2023   BUN 15 09/12/2023   CREATININE 1.07 09/12/2023   GFR 55.23 (L) 09/12/2023   CALCIUM  10.0 09/12/2023   PROT 7.6 09/12/2023   ALBUMIN 4.7 09/12/2023   BILITOT 0.6 09/12/2023   ALKPHOS 101 09/12/2023   AST 56 (H) 09/12/2023   ALT 106 (H) 09/12/2023   IMPRESSION AND PLAN:  Acute cough with left pectoral/axillary pain.  She appears well. Suspect viral respiratory infection with pleuritic chest pain. However, will check D-dimer and if positive will need to move forward with CT angio chest. Also, will do chest x-ray today to rule out small pneumothorax.   An After Visit Summary was printed and given to the patient.  FOLLOW UP: Return if symptoms worsen or fail to improve in 5-7 days.  Signed:  Gerlene Hockey, MD           04/02/2024

## 2024-04-02 NOTE — Patient Instructions (Signed)
 Take 2 OTC aleve tabs with food twice a day for 5 days

## 2024-04-03 ENCOUNTER — Emergency Department (HOSPITAL_BASED_OUTPATIENT_CLINIC_OR_DEPARTMENT_OTHER)
Admission: EM | Admit: 2024-04-03 | Discharge: 2024-04-03 | Disposition: A | Discharge status: Home / Self Care | Source: Ambulatory Visit | Attending: Emergency Medicine | Admitting: Emergency Medicine

## 2024-04-03 ENCOUNTER — Other Ambulatory Visit: Payer: Self-pay

## 2024-04-03 ENCOUNTER — Encounter: Payer: Self-pay | Admitting: Family Medicine

## 2024-04-03 ENCOUNTER — Emergency Department (HOSPITAL_BASED_OUTPATIENT_CLINIC_OR_DEPARTMENT_OTHER)

## 2024-04-03 DIAGNOSIS — I10 Essential (primary) hypertension: Secondary | ICD-10-CM | POA: Insufficient documentation

## 2024-04-03 DIAGNOSIS — R0789 Other chest pain: Secondary | ICD-10-CM | POA: Diagnosis present

## 2024-04-03 LAB — BASIC METABOLIC PANEL WITH GFR
Anion gap: 13 (ref 5–15)
BUN: 14 mg/dL (ref 8–23)
CO2: 26 mmol/L (ref 22–32)
Calcium: 10.2 mg/dL (ref 8.9–10.3)
Chloride: 100 mmol/L (ref 98–111)
Creatinine, Ser: 1.04 mg/dL — ABNORMAL HIGH (ref 0.44–1.00)
GFR, Estimated: 60 mL/min — ABNORMAL LOW (ref >60)
Glucose, Bld: 146 mg/dL — ABNORMAL HIGH (ref 70–99)
Potassium: 3.5 mmol/L (ref 3.5–5.1)
Sodium: 140 mmol/L (ref 135–145)

## 2024-04-03 LAB — PRO BRAIN NATRIURETIC PEPTIDE: Pro Brain Natriuretic Peptide: 89.9 pg/mL (ref <300.0)

## 2024-04-03 LAB — CBC
HCT: 41 % (ref 36.0–46.0)
Hemoglobin: 14 g/dL (ref 12.0–15.0)
MCH: 30.4 pg (ref 26.0–34.0)
MCHC: 34.1 g/dL (ref 30.0–36.0)
MCV: 88.9 fL (ref 80.0–100.0)
Platelets: 327 K/uL (ref 150–400)
RBC: 4.61 MIL/uL (ref 3.87–5.11)
RDW: 12.8 % (ref 11.5–15.5)
WBC: 5.5 K/uL (ref 4.0–10.5)
nRBC: 0 % (ref 0.0–0.2)

## 2024-04-03 LAB — TROPONIN T, HIGH SENSITIVITY: Troponin T High Sensitivity: <15 ng/L (ref 0–19)

## 2024-04-03 MED ORDER — IOHEXOL 350 MG/ML SOLN
75.0000 mL | Freq: Once | INTRAVENOUS | Status: AC | PRN
Start: 2024-04-03 — End: 2024-04-03
  Administered 2024-04-03: 75 mL via INTRAVENOUS

## 2024-04-03 MED ORDER — SODIUM CHLORIDE 0.9 % IV BOLUS
1000.0000 mL | Freq: Once | INTRAVENOUS | Status: AC
  Administered 2024-04-03: 1000 mL via INTRAVENOUS

## 2024-04-03 NOTE — Discharge Instructions (Signed)
 You were evaluated in the emergency room for chest pain.  Your lab work and imaging did not show any significant abnormality.  Please follow-up with your primary care doctor for further management.  If you experience any new or worsening symptoms please return to the emergency room.

## 2024-04-03 NOTE — ED Provider Notes (Signed)
 [place] EMERGENCY DEPARTMENT AT [facility] Provider Note   CSN: [id number] Arrival date & time: 04/03/24  0745     Patient presents with: Chest Pain   Belinda Cox is a 63 y.o. female with history of hypertension presents with complaints of chest pain since Saturday.  States pain is constant, worse with coughing.  Although she denies any significant cough or URI symptoms.  Symptoms are not exertional.  She has no associated shortness of breath, dizziness or nausea.  No abdominal pain.  She has no cardiac history.  No prior blood clots.  She is not on blood thinners.  No recent surgeries, travel, hospitalizations or known malignancy.  She does report that her lower extremities will occasionally swell up a little bit.  Was referred here by PCP to evaluate for PE.    Chest Pain   Past Medical History:  Diagnosis Date   Asymptomatic cholelithiasis 07/2020   Chronic renal insufficiency, stage II (mild)    GFR 60s   Elevated transaminase level    Viral hep serologies neg. Fatty liver on u/s 07/2020.   Fatty liver 07/2020   u/s documented   GERD (gastroesophageal reflux disease)    History of abnormal Pap smear    AGUS; ASCUS, with high risk HPV+, (colpo neg 09/2012--repeat pap 12/16/15 nl, no HR HPV--per GYN needs pap and HR HPV testing again in 3 yrs))   History of adenomatous polyp of colon 2015, 2020   Recall 02/2024   History of colitis    focal area of chronic active colitis on colonoscopy 02/2019   Hyperlipemia, mixed 02/2019   Great response to atorva    Hypertension    IFG (impaired fasting glucose) 07/2016   HbA1c 5.5%   Osteoarthritis    fingers   Seasonal allergic rhinitis 09/10/2012   Past Surgical History:  Procedure Laterality Date   COLONOSCOPY W/ POLYPECTOMY  08/2013; 02/19/2019   02/2019 focal colitis (cecum).->path showed chronic active colitis.  Adenoma x 1. Recall 34yr   COLPOSCOPY     POLYPECTOMY     Unremarkable         Prior to Admission  medications   Medication Sig Start Date End Date Taking? Authorizing Provider  atorvastatin  (LIPITOR) 20 MG tablet TAKE 1 TABLET BY MOUTH EVERY DAY 02/15/24   McGowen, Aleene DEL, MD  cetirizine (ZYRTEC) 10 MG tablet Take 10 mg by mouth daily.    [provider]  hydrochlorothiazide  (HYDRODIURIL ) 25 MG tablet TAKE 1 TABLET (25 MG TOTAL) BY MOUTH DAILY. 02/15/24   McGowen, Aleene DEL, MD  pantoprazole  (PROTONIX ) 40 MG tablet Take 1 tablet (40 mg total) by mouth 2 (two) times daily. 09/12/23   McGowen, Aleene DEL, MD  potassium chloride  SA (KLOR-CON  M20) 20 MEQ tablet Take 1 tablet (20 mEq total) by mouth daily. 11/09/23   McGowen, Aleene DEL, MD  triamcinolone  (NASACORT  ALLERGY 24HR) 55 MCG/ACT AERO nasal inhaler Place 2 sprays into the nose daily.    [provider]    Allergies: Patient has no known allergies.    Review of Systems  Cardiovascular:  Positive for chest pain.    Updated Vital Signs BP 111/70   Pulse 79   Temp 98.3 F (36.8 C)   Resp 18   LMP 09/07/2012   SpO2 96%   Physical Exam Vitals and nursing note reviewed.  Constitutional:      General: She is not in acute distress.    Appearance: She is well-developed.  HENT:  Head: Normocephalic and atraumatic.  Eyes:     Conjunctiva/sclera: Conjunctivae normal.  Cardiovascular:     Rate and Rhythm: Normal rate and regular rhythm.     Heart sounds: No murmur heard. Pulmonary:     Effort: Pulmonary effort is normal. No respiratory distress.     Breath sounds: Normal breath sounds.  Abdominal:     Palpations: Abdomen is soft.     Tenderness: There is no abdominal tenderness.  Musculoskeletal:     Cervical back: Neck supple.     Comments: Trace bilateral lower extremity edema  Skin:    General: Skin is warm and dry.     Capillary Refill: Capillary refill takes less than 2 seconds.  Neurological:     Mental Status: She is alert.  Psychiatric:        Mood and Affect: Mood normal.     (all labs ordered  are listed, but only abnormal results are displayed) Labs Reviewed  BASIC METABOLIC PANEL WITH GFR - Abnormal; Notable for the following components:      Result Value   Glucose, Bld 146 (*)    Creatinine, Ser 1.04 (*)    GFR, Estimated 60 (*)    All other components within normal limits  CBC  PRO BRAIN NATRIURETIC PEPTIDE  TROPONIN T, HIGH SENSITIVITY    EKG: EKG Interpretation Date/Time:  Thursday April 03 2024 07:53:14 EST Ventricular Rate:  102 PR Interval:  136 QRS Duration:  86 QT Interval:  334 QTC Calculation: 435 R Axis:   54  Text Interpretation: Sinus tachycardia Nonspecific ST abnormality Abnormal ECG No previous ECGs available Confirmed by Mannie Pac [phone]) on 04/03/2024 10:32:58 AM  Radiology: CT Angio Chest PE W and/or Wo Contrast Result Date: 04/03/2024 CLINICAL DATA:  Pulmonary embolism (PE) suspected, low to intermediate prob, positive D-dimer Left upper chest pain for 5 days. EXAM: CT ANGIOGRAPHY CHEST WITH CONTRAST TECHNIQUE: Multidetector CT imaging of the chest was performed using the standard protocol during bolus administration of intravenous contrast. Multiplanar CT image reconstructions and MIPs were obtained to evaluate the vascular anatomy. RADIATION DOSE REDUCTION: This exam was performed according to the departmental dose-optimization program which includes automated exposure control, adjustment of the mA and/or kV according to patient size and/or use of iterative reconstruction technique. CONTRAST:  75mL OMNIPAQUE  IOHEXOL  350 MG/ML SOLN COMPARISON:  Radiographs 04/02/2024. FINDINGS: Cardiovascular: The pulmonary arteries are well opacified with contrast to the level of the segmental branches. There is no evidence of acute pulmonary embolism. Mild atherosclerosis of the aorta, great vessels and coronary arteries. No acute systemic arterial abnormalities are identified. The heart size is normal. Small nodule along the anterior wall of the left atrium  measures up to 1.2 cm in diameter and is subjectively fat density, possibly a small lipoma. No significant pericardial fluid. Mediastinum/Nodes: There are no enlarged mediastinal, hilar or axillary lymph nodes. The thyroid gland, trachea and esophagus demonstrate no significant findings. Lungs/Pleura: No pleural effusion or pneumothorax. Mild atelectasis or scarring at both lung bases. No confluent airspace disease or suspicious nodularity. Upper abdomen: Hepatomegaly and diffusely decreased density throughout the liver consistent with steatosis. There are several small calcified gallstones. Linear calcifications within the right adrenal gland, likely related to previous hemorrhage. Musculoskeletal/Chest wall: There is no chest wall mass or suspicious osseous finding. Review of the MIP images confirms the above findings. IMPRESSION: 1. No evidence of acute pulmonary embolism or other acute chest findings. 2. Hepatomegaly and hepatic steatosis. 3. Cholelithiasis. 4.  Aortic Atherosclerosis (  ICD10-I70.0). Electronically Signed   By: Elsie Perone M.D.   On: 04/03/2024 11:05   DG Chest 2 View Result Date: 04/03/2024 EXAM: 2 VIEW(S) XRAY OF THE CHEST 04/02/2024 12:31:49 PM COMPARISON: None available. CLINICAL HISTORY: dry cough and pleuritic chest pain FINDINGS: LUNGS AND PLEURA: No focal pulmonary opacity. No pleural effusion. No pneumothorax. HEART AND MEDIASTINUM: No acute abnormality of the cardiac and mediastinal silhouettes. BONES AND SOFT TISSUES: Thoracic degenerative changes. IMPRESSION: 1. No acute cardiopulmonary process identified. Electronically signed by: Norleen Boxer MD 04/03/2024 09:35 AM EST RP Workstation: [id number]     Procedures   Medications Ordered in the ED  iohexol  (OMNIPAQUE ) 350 MG/ML injection 75 mL (75 mLs Intravenous Contrast Given 04/03/24 1005)  sodium chloride  0.9 % bolus 1,000 mL (1,000 mLs Intravenous New Bag/Given 04/03/24 1107)    Clinical Course as of 04/03/24 1122   Thu Apr 03, 2024  0912 Patient with history of hypertension evaluated for complaints of pleuritic chest pain since Saturday.  Upon arrival patient is tachycardic and hypertensive to 160/76.  Otherwise hemodynamically stable.  Her lungs are clear.  She has trace bilateral lower extremity edema.  She had outpatient dimer that was mildly elevated to 0.51.  This is age appropriate.  However given her unexplained tachycardia and symptoms will obtain CT angio PE [JT]  1111 CT Angio Chest PE W and/or Wo Contrast No evidence of PE, there is some mild atelectasis. [JT]  1111 Workup is overall reassuring.  Ultimately patient will be discharged home.  Encouraged close PCP follow-up.  Strict return precautions provided.  Patient is understanding and agreement with plan. [JT]    Clinical Course User Index [JT] Donnajean Lynwood DEL, PA-C                                 Medical Decision Making Amount and/or Complexity of Data Reviewed Labs: ordered.   This patient presents to the ED with chief complaint(s) of chest pain.  The complaint involves an extensive differential diagnosis and also carries with it a high risk of complications and morbidity.   Pertinent past medical history as listed in HPI  The differential diagnosis includes  Do not suspect ACS as patient is without any EKG changes and troponins are without elevation.  No evidence of PE or pneumonia on CT.  Chest x-ray is without evidence of pneumothorax.  Do not suspect aortic dissection based off exam and history. Additional history obtained: Records reviewed Care Everywhere/External Records and Primary Care Documents  Disposition:   Patient will be discharged home. The patient has been appropriately medically screened and/or stabilized in the ED. I have low suspicion for any other emergent medical condition which would require further screening, evaluation or treatment in the ED or require inpatient management. At time of discharge the patient is  hemodynamically stable and in no acute distress. I have discussed work-up results and diagnosis with patient and answered all questions. Patient is agreeable with discharge plan. We discussed strict return precautions for returning to the emergency department and they verbalized understanding.     Social Determinants of Health:   none  This note was dictated with voice recognition software.  Despite best efforts at proofreading, errors may have occurred which can change the documentation meaning.       Final diagnoses:  Atypical chest pain    ED Discharge Orders     None  Donnajean Lynwood DEL, PA-C 04/03/24 1122    Mannie Pac T, DO 04/04/24 [phone]

## 2024-04-03 NOTE — ED Triage Notes (Addendum)
 Patient states left upper chest pain since Saturday. States saw her PCP who thought it was pleurisy but sent here here to rule out clots Worse when moving left arm or coughing

## 2024-04-03 NOTE — ED Notes (Signed)
 Extra Blue top drawn with initial labs and sent to lab.

## 2024-04-18 ENCOUNTER — Other Ambulatory Visit: Payer: Self-pay | Admitting: Family Medicine

## 2024-04-22 ENCOUNTER — Other Ambulatory Visit: Payer: Self-pay | Admitting: Family Medicine
